# Patient Record
Sex: Female | Born: 1990
Health system: Southern US, Community
[De-identification: ages and names within clinical notes are randomized; demographics above are authoritative.]

## PROBLEM LIST (undated history)

## (undated) DIAGNOSIS — E559 Vitamin D deficiency, unspecified: Secondary | ICD-10-CM

## (undated) DIAGNOSIS — J301 Allergic rhinitis due to pollen: Secondary | ICD-10-CM

## (undated) DIAGNOSIS — J45909 Unspecified asthma, uncomplicated: Secondary | ICD-10-CM

## (undated) DIAGNOSIS — F32A Depression, unspecified: Secondary | ICD-10-CM

## (undated) DIAGNOSIS — F419 Anxiety disorder, unspecified: Secondary | ICD-10-CM

## (undated) DIAGNOSIS — F329 Major depressive disorder, single episode, unspecified: Secondary | ICD-10-CM

## (undated) DIAGNOSIS — G629 Polyneuropathy, unspecified: Secondary | ICD-10-CM

## (undated) DIAGNOSIS — M1990 Unspecified osteoarthritis, unspecified site: Secondary | ICD-10-CM

## (undated) DIAGNOSIS — D509 Iron deficiency anemia, unspecified: Secondary | ICD-10-CM

## (undated) DIAGNOSIS — G43909 Migraine, unspecified, not intractable, without status migrainosus: Secondary | ICD-10-CM

## (undated) DIAGNOSIS — K219 Gastro-esophageal reflux disease without esophagitis: Secondary | ICD-10-CM

## (undated) HISTORY — PX: WISDOM TOOTH EXTRACTION: SHX21

## (undated) HISTORY — DX: Gastro-esophageal reflux disease without esophagitis: K21.9

## (undated) HISTORY — DX: Allergic rhinitis due to pollen: J30.1

## (undated) HISTORY — PX: FOOT SURGERY: SHX648

## (undated) HISTORY — DX: Unspecified osteoarthritis, unspecified site: M19.90

---
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
## 2013-01-16 HISTORY — PX: FLAT FOOT CORRECTION: SHX6619

## 2018-01-23 ENCOUNTER — Encounter: Payer: Self-pay | Admitting: Family Medicine

## 2018-01-23 ENCOUNTER — Ambulatory Visit (INDEPENDENT_AMBULATORY_CARE_PROVIDER_SITE_OTHER): Payer: No Typology Code available for payment source | Admitting: Family Medicine

## 2018-01-23 VITALS — BP 112/86 | HR 92 | Temp 98.2°F | Ht 65.0 in | Wt 171.0 lb

## 2018-01-23 DIAGNOSIS — H6123 Impacted cerumen, bilateral: Secondary | ICD-10-CM | POA: Diagnosis not present

## 2018-01-23 DIAGNOSIS — F339 Major depressive disorder, recurrent, unspecified: Secondary | ICD-10-CM

## 2018-01-23 DIAGNOSIS — M1990 Unspecified osteoarthritis, unspecified site: Secondary | ICD-10-CM

## 2018-01-23 DIAGNOSIS — K219 Gastro-esophageal reflux disease without esophagitis: Secondary | ICD-10-CM

## 2018-01-23 DIAGNOSIS — M2141 Flat foot [pes planus] (acquired), right foot: Secondary | ICD-10-CM | POA: Diagnosis not present

## 2018-01-23 DIAGNOSIS — Z7689 Persons encountering health services in other specified circumstances: Secondary | ICD-10-CM

## 2018-01-23 DIAGNOSIS — M2142 Flat foot [pes planus] (acquired), left foot: Secondary | ICD-10-CM

## 2018-01-23 NOTE — Progress Notes (Addendum)
Patient presents to clinic today to establish care.  SUBJECTIVE: PMH: Pt is a 28 yo female with pmh sig for congenital flat feet, arthritis, seasonal allergies, h/o depression, GERD, frequent HAs.  Pt was previously seen in [place].  Pt was seen briefly at [facility].  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  GERD: -not currently taking any medicine -Notes symptoms with sugary drinks and increased alcohol intake.  Arthritis: -Mainly in the right foot -s/p surgery for congenital flat foot -May take ibuprofen 600 to 800 mg 3 times daily  H/o Depression: -notes dealing with symptoms off and on for a few yrs -tried multiple meds, notes S/Es: Celexa-constipation, Viibryd-jittery, Wellbutrin-no side effects, Zoloft 200 mg x 2 yrs- stopped working, had sexual side effects and vivid dreams. -Currently taking Effexor ER 75 mg daily.  On x 1-1.5 yrs. -Notes weight gain and vivid dreams.  Was 130lbs  -Interested in stopping if excellent. -had counseling in the past.  Does not have a provider in the area. -endorses good mood, sleep, and ok energy.  Seasonal allergies: -Endorses using azelastine and Flonase  Headaches/migraines: -symptoms with increased sinus pressure -Will take Excedrin and Allegra-D  Allergies: NKDA  Past surgical history: Left foot reconstruction 2015  Social history: Patient is single.  She is currently employed as a Marine scientist.  Patient endorses social alcohol use.  Pt denies tobacco and drug use.  Health Maintenance: Last Pap--2018,  H/o abnormal Pap 2017 LMP 01/23/2018 Immunizations --influenza vaccine 2019, TB test 2019 Colonoscopy --2018  Family medical history: Mother-alive, HTN MGM-alive, arthritis, multiple myeloma,  COPD, MI, heart disease, HLD, HTN, stroke MGF-deceased, alcohol abuse, multiple myeloma, MI, HLD  Past Medical History:  Diagnosis Date  . Arthritis   . GERD (gastroesophageal reflux disease)   . Hay fever     Past Surgical History:  Procedure Laterality Date  . FLAT FOOT CORRECTION Right 2015    Current Outpatient Medications on File Prior to Visit  Medication Sig Dispense Refill  . venlafaxine (EFFEXOR) 75 MG tablet Take 75 mg by mouth daily.     No current facility-administered medications on file prior to visit.     No Known Allergies  History reviewed. No pertinent family history.  Social History   Socioeconomic History  . Marital status: Single    Spouse name: Not on file  . Number of children: Not on file  . Years of education: Not on file  . Highest education level: Not on file  Occupational History  . Not on file  Social Needs  . Financial resource strain: Not on file  . Food insecurity:    Worry: Not on file    Inability: Not on file  . Transportation needs:    Medical: Not on file    Non-medical: Not on file  Tobacco Use  . Smoking status: Never Smoker  . Smokeless tobacco: Never Used  Substance and Sexual Activity  . Alcohol use: Yes    Comment: occassionally  . Drug use: Never  . Sexual activity: Not on file  Lifestyle  . Physical activity:    Days per week: Not on file    Minutes per session: Not on file  . Stress: Not on file  Relationships  . Social connections:  Talks on phone: Not on file    Gets together: Not on file    Attends religious service: Not on file    Active member of club or organization: Not on file    Attends meetings of clubs or organizations: Not on file    Relationship status: Not on file  . Intimate partner violence:    Fear of current or ex partner: Not on file    Emotionally abused: Not on file    Physically abused: Not on file    Forced sexual activity: Not on file  Other Topics Concern  . Not on file    Social History Narrative  . Not on file    ROS General: Denies fever, chills, night sweats, changes in weight, changes in appetite HEENT: Denies headaches, ear pain, changes in vision, rhinorrhea, sore throat CV: Denies CP, palpitations, SOB, orthopnea Pulm: Denies SOB, cough, wheezing GI: Denies abdominal pain, nausea, vomiting, diarrhea, constipation GU: Denies dysuria, hematuria, frequency, vaginal discharge Msk: Denies muscle cramps, joint pains  +foot pain R>L, flat feet Neuro: Denies weakness, numbness, tingling Skin: Denies rashes, bruising Psych: Denies depression, anxiety, hallucinations  +h/o depression  BP 112/86 (BP Location: Left Arm, Patient Position: Sitting, Cuff Size: Normal)   Pulse 92   Temp 98.2 F (36.8 C) (Oral)   Ht _0  (1.651 m)   Wt 171 lb (77.6 kg)   LMP 01/23/2018 (Exact Date)   SpO2 98%   BMI 28.46 kg/m   Physical Exam Gen. Pleasant, well developed, well-nourished, in NAD HEENT - [place]/AT, PERRL, no scleral icterus, no nasal drainage, pharynx without erythema or exudate.  B/l canals occluded with cerumen.  TMs normal after irrigation. Lungs: no use of accessory muscles, CTAB, no wheezes, rales or rhonchi Cardiovascular: RRR, No r/g/m, no peripheral edema Abdomen: BS present, soft, nontender,nondistended Neuro:  A&Ox3, CN II-XII intact, normal gait Skin:  Warm, dry, intact, no lesions  No results found for this or any previous visit (from the past 2160 hour(s)).  Assessment/Plan: Bilateral impacted cerumen -Consent obtained.  Bilateral ears irrigated.  Patient tolerated procedure well. -Given handout -Consider Debrox ear gtts.  Flat feet, bilateral  -discussed continuing orthotic use - Plan: Ambulatory referral to Podiatry  Gastroesophageal reflux disease without esophagitis -continue to avoid foods known to cause problems -given handout -if symptoms become worse discussed starting medication such as omeprazole   Arthritis  -R foot s/p  multiple foot surgeries for h/o flat feet. -continue NSAIDs prn, however advised not to take on empty stomach - Plan: Ambulatory referral to Podiatry  Depression, recurrent ([place]) -Discussed weaning off Effexor XR given weight gain, vivid dreams, and decreased effectiveness -will start Effexor XR 37.5 mg daily -obtain PHQ9 and GAD 7 -Given pt's extensive h/o s/e with various depression meds pt to find an area Teacher, music. -Given a list of area Psychiatrist, encouraged to set up appt. -pt to f/u in 6wks, sooner if needed  Encounter to establish care  -We reviewed the PMH, PSH, FH, SH, Meds and Allergies. -We provided refills for any medications we will prescribe as needed. -We addressed current concerns per orders and patient instructions. -We have asked for records for pertinent exams, studies, vaccines and notes from previous providers. -We have advised patient to follow up per instructions below.  F/u in 6 wks  Grier Mitts, MD

## 2018-01-23 NOTE — Patient Instructions (Signed)
Earwax Buildup, Adult  The ears produce a substance called earwax that helps keep bacteria out of the ear and protects the skin in the ear canal. Occasionally, earwax can build up in the ear and cause discomfort or hearing loss.  What increases the risk?  This condition is more likely to develop in people who:  · Are female.  · Are elderly.  · Naturally produce more earwax.  · Clean their ears often with cotton swabs.  · Use earplugs often.  · Use in-ear headphones often.  · Wear hearing aids.  · Have narrow ear canals.  · Have earwax that is overly thick or sticky.  · Have eczema.  · Are dehydrated.  · Have excess hair in the ear canal.  What are the signs or symptoms?  Symptoms of this condition include:  · Reduced or muffled hearing.  · A feeling of fullness in the ear or feeling that the ear is plugged.  · Fluid coming from the ear.  · Ear pain.  · Ear itch.  · Ringing in the ear.  · Coughing.  · An obvious piece of earwax that can be seen inside the ear canal.  How is this diagnosed?  This condition may be diagnosed based on:  · Your symptoms.  · Your medical history.  · An ear exam. During the exam, your health care provider will look into your ear with an instrument called an otoscope.  You may have tests, including a hearing test.  How is this treated?  This condition may be treated by:  · Using ear drops to soften the earwax.  · Having the earwax removed by a health care provider. The health care provider may:  ? Flush the ear with water.  ? Use an instrument that has a loop on the end (curette).  ? Use a suction device.  · Surgery to remove the wax buildup. This may be done in severe cases.  Follow these instructions at home:    · Take over-the-counter and prescription medicines only as told by your health care provider.  · Do not put any objects, including cotton swabs, into your ear. You can clean the opening of your ear canal with a washcloth or facial tissue.  · Follow instructions from your health care  provider about cleaning your ears. Do not over-clean your ears.  · Drink enough fluid to keep your urine clear or pale yellow. This will help to thin the earwax.  · Keep all follow-up visits as told by your health care provider. If earwax builds up in your ears often or if you use hearing aids, consider seeing your health care provider for routine, preventive ear cleanings. Ask your health care provider how often you should schedule your cleanings.  · If you have hearing aids, clean them according to instructions from the manufacturer and your health care provider.  Contact a health care provider if:  · You have ear pain.  · You develop a fever.  · You have blood, pus, or other fluid coming from your ear.  · You have hearing loss.  · You have ringing in your ears that does not go away.  · Your symptoms do not improve with treatment.  · You feel like the room is spinning (vertigo).  Summary  · Earwax can build up in the ear and cause discomfort or hearing loss.  · The most common symptoms of this condition include reduced or muffled hearing and a feeling of   fullness in the ear or feeling that the ear is plugged.  · This condition may be diagnosed based on your symptoms, your medical history, and an ear exam.  · This condition may be treated by using ear drops to soften the earwax or by having the earwax removed by a health care provider.  · Do not put any objects, including cotton swabs, into your ear. You can clean the opening of your ear canal with a washcloth or facial tissue.  This information is not intended to replace advice given to you by your health care provider. Make sure you discuss any questions you have with your health care provider.  Document Released: 02/10/2004 Document Revised: 12/14/2016 Document Reviewed: 03/15/2016  Elsevier Interactive Patient Education © 2019 [employer] Inc[employer]

## 2018-01-27 ENCOUNTER — Encounter: Payer: Self-pay | Admitting: Family Medicine

## 2018-01-27 DIAGNOSIS — M2141 Flat foot [pes planus] (acquired), right foot: Secondary | ICD-10-CM | POA: Insufficient documentation

## 2018-01-27 DIAGNOSIS — K219 Gastro-esophageal reflux disease without esophagitis: Secondary | ICD-10-CM | POA: Insufficient documentation

## 2018-01-27 DIAGNOSIS — F339 Major depressive disorder, recurrent, unspecified: Secondary | ICD-10-CM | POA: Insufficient documentation

## 2018-01-27 DIAGNOSIS — M1990 Unspecified osteoarthritis, unspecified site: Secondary | ICD-10-CM | POA: Insufficient documentation

## 2018-01-27 DIAGNOSIS — M2142 Flat foot [pes planus] (acquired), left foot: Secondary | ICD-10-CM | POA: Insufficient documentation

## 2018-02-01 ENCOUNTER — Ambulatory Visit: Payer: No Typology Code available for payment source | Admitting: Podiatry

## 2018-02-07 ENCOUNTER — Other Ambulatory Visit: Payer: Self-pay

## 2018-02-08 ENCOUNTER — Encounter: Payer: Self-pay | Admitting: Podiatry

## 2018-02-08 ENCOUNTER — Ambulatory Visit: Payer: No Typology Code available for payment source | Admitting: Podiatry

## 2018-02-08 ENCOUNTER — Ambulatory Visit (INDEPENDENT_AMBULATORY_CARE_PROVIDER_SITE_OTHER): Payer: No Typology Code available for payment source

## 2018-02-08 VITALS — BP 119/87 | HR 78

## 2018-02-08 DIAGNOSIS — M2142 Flat foot [pes planus] (acquired), left foot: Secondary | ICD-10-CM

## 2018-02-08 DIAGNOSIS — M2141 Flat foot [pes planus] (acquired), right foot: Secondary | ICD-10-CM

## 2018-02-08 DIAGNOSIS — M25571 Pain in right ankle and joints of right foot: Secondary | ICD-10-CM

## 2018-02-10 NOTE — Progress Notes (Addendum)
  Subjective:  Patient ID: Jacqueline Sims, female    DOB: Oct 31, 1990,  MRN: [id number]  Chief Complaint  Patient presents with  . Flat Foot    bilateral - she has had multiple surgeries on her right foot - interested in new orthotics especially for the right foot. She got her first orthotics at age 28, but did not wear them because they hurt her feet   28 y.o. female presents with the above complaint.  Had history of flatfoot correction on the right foot having a lot of pain interested in possible injection therapy as well as pursuing orthotics.  Has seen Dr. Loletta Specter for this who told her she may need a subtalar joint fusion in the future.  Review of Systems: Negative except as noted in the HPI. Denies N/V/F/Ch.  Past Medical History:  Diagnosis Date  . Arthritis   . GERD (gastroesophageal reflux disease)   . Hay fever     Current Outpatient Medications:  .  azelastine (ASTELIN) 0.1 % nasal spray, Place into the nose., Disp: , Rfl:  .  celecoxib (CELEBREX) 100 MG capsule, , Disp: , Rfl:  .  fluticasone (FLONASE) 50 MCG/ACT nasal spray, Place into the nose., Disp: , Rfl:  .  loratadine-pseudoephedrine (CLARITIN-D 12-HOUR) 5-120 MG tablet, Take by mouth., Disp: , Rfl:  .  norgestimate-ethinyl estradiol (ORTHO-CYCLEN,SPRINTEC,PREVIFEM) 0.25-35 MG-MCG tablet, Take by mouth., Disp: , Rfl:  .  venlafaxine (EFFEXOR) 75 MG tablet, Take 75 mg by mouth daily., Disp: , Rfl:   Social History   Tobacco Use  Smoking Status Never Smoker  Smokeless Tobacco Never Used    Allergies  Allergen Reactions  . Lactose Nausea And Vomiting   Objective:   Vitals:   02/08/18 0752  BP: 119/87  Pulse: 78   There is no height or weight on file to calculate BMI. Constitutional Well developed. Well nourished.  Vascular Dorsalis pedis pulses palpable bilaterally. Posterior tibial pulses palpable bilaterally. Capillary refill normal to all digits.  No cyanosis or clubbing noted. Pedal hair  growth normal.  Neurologic Normal speech. Oriented to person, place, and time. Epicritic sensation to light touch grossly present bilaterally.  Dermatologic Nails well groomed and normal in appearance. No open wounds. No skin lesions. Well-healed incisions right foot  Orthopedic: Normal joint ROM without pain or crepitus bilaterally. No visible deformities. Pain palpation about the right sinus tarsi   Radiographs: Taken and reviewed evidence of prior calcaneal osteotomies with intact implants.  Posterior facet arthritic changes noted. Assessment:   1. Flat feet, bilateral   2. Sinus tarsi syndrome of right ankle   3. Arthralgia of hindfoot, right   4. Pain in joint of right foot    Plan:  Patient was evaluated and treated and all questions answered.  Sinus tarsitis right, concomitant flatfoot s/p correction -XR reviewed as above. -Injection delivered to the right sinus tarsi as below -Would benefit from custom molded orthotics.  Casted today met with orthotist Velora Heckler for fabrication -Would likely benefit from subtalar joint arthrodesis in the future.  Procedure: Injection Subtalar Joint Consent: Verbal consent obtained. Location: Right subtalar joint Skin prep: Betadine. Injectate: 1 cc 0.5% marcaine plain, 1 cc dexamethasone phosphate, 0.5 cc kenalog 10. Disposition: Patient tolerated procedure well. Injection site dressed with a band-aid.     Return in about 3 weeks (around 03/01/2018) for Sinus tarsitis Right.

## 2018-02-15 ENCOUNTER — Ambulatory Visit (INDEPENDENT_AMBULATORY_CARE_PROVIDER_SITE_OTHER): Payer: No Typology Code available for payment source | Admitting: Psychiatry

## 2018-02-15 DIAGNOSIS — F3111 Bipolar disorder, current episode manic without psychotic features, mild: Secondary | ICD-10-CM | POA: Diagnosis not present

## 2018-02-15 DIAGNOSIS — F411 Generalized anxiety disorder: Secondary | ICD-10-CM | POA: Diagnosis not present

## 2018-02-15 MED ORDER — VENLAFAXINE HCL 37.5 MG PO TABS: 37.5000 mg | ORAL_TABLET | Freq: Every day | ORAL | 0 refills | Status: DC

## 2018-02-15 MED ORDER — DIVALPROEX SODIUM ER 500 MG PO TB24: ORAL_TABLET | ORAL | 0 refills | Status: DC

## 2018-02-15 MED FILL — DIVALPROEX SOD ER 500 MG TA: 500 | 30 days supply | Qty: 60 | Fill #0

## 2018-02-15 MED FILL — VENLAFAXINE HCL 37.5 MG TAB: 37.5 | 14 days supply | Qty: 14 | Fill #0

## 2018-02-15 NOTE — Progress Notes (Signed)
[facility]  Patient ID: Jacqueline Sims,  MRN: [id number]  PCP: Billie Ruddy, MD  Date of Evaluation: 02/15/2018  Comer Locket, PA-C [facility] MD/PA/NP Initial Note  02/15/2018 9:29 AM Jacqueline Sims  MRN:  [id number]  Chief Complaint: Feeling hyper  HPI: Patient feels hyper.  Is been several months, increased talking, occasionally decreased sleep, impulsive spending, goal oriented.  Denies grandiosity.  Had prior episodes of shorter duration occurring once or twice a month; 2-3 times a year.  Her MDQ was positive with moderate problems.  He does notice being able to go from a good mood quickly to a depressive mood.  Also has irritable moods. She has had depression off and on since she is 28 years old.  At 28 years old to 62 she took medicines.  When she is depressed she works less and lays in bed.  Episodes last 2 to 3 months most recent was the spring of last year.  Episodes can also last just 1 or 2 days and he has she has 1 at least 1 year.  During her depression she did not cry, I does isolate, decreased energy, decreased motivation, increased sleep, decreased ADLs including decreased bathing.  Has anhedonia.  Said suicidal thoughts the first time was at 28 years old it lasted for 5 months had a bad depression and suicidal thoughts again at 28 years old this suicidality occur when she is really depressed.  suicidal plans include pills, hanging, motor vehicle accident, no gun.  She says suicidal plan twice in the past 6 years.  Her only suicide gesture was cutting.  Most recent suicidal thoughts March 2019.  She is not depressed now. Anxiety is social anxiety is no worse panic attacks 2 years ago.  OCD has obsessive thoughts No psychosis Patient wonders if she is ADD.  Visit Diagnosis: Borderline personality disorder. Bipolar disorder.  Past Psychiatric History: Her depression started at 28 years old.  She is hyper at least 6 years.  She has seen a psychiatrist  in the past in [place].  She has never been hospitalized.  She did take Zoloft at 28 years old. Past medicine Strattera Zoloft Effexor Celexa and Viibryd important to note that Viibryd caused jitteriness energy denies decreased sleep not impulsive possible grandiosity talks more goal oriented this last several weeks it stopped and resolved this is similar to her hyper moods.  Past Medical History: Past medical history is negative except for foot surgery. Past Medical History:  Diagnosis Date  . Arthritis   . GERD (gastroesophageal reflux disease)   . Hay fever     Past Surgical History:  Procedure Laterality Date  . FLAT FOOT CORRECTION Right 2015    Family Psychiatric History: Mother and cousins are have anxiety.  Depression in her mom.  Eating disorder as a cousin.  Suicide attempt with her cousin. Grandfather and uncle and mom with problems with alcohol.  One uncle has problems with drugs.  Past abuse mom was abused.  Family History: MI in her grandmother and grandfather, cardiac dysrhythmias in her grandmother, stroke in a grandmother.  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  She is single with no children.  Religious or spiritual things are not of interest to her. Caffeine 2 to 2 cups a day.  No tobacco.  Rare alcohol 2-3 times a year.  Not using any drugs.  In the past she has had increased alcohol she not had blackouts  or seizure or withdrawals.  Past drugs tried were pot, LSD x1, mushrooms x1, cocaine x1, Vyvanse x1. Current medications include Effexor XR 75 mg a day. She is not allergic to any medicines.  Sleep she has problems with sleep onset and sleep maintenance and she does seem to have a night eating syndrome 4 to 5 days a week Social History   Socioeconomic History  . Marital status: Single    Spouse name: Not on file  . Number of children: Not on file  . Years of education: Not on file  . Highest education level: Not on  file  Occupational History  . Not on file  Social Needs  . Financial resource strain: Not on file  . Food insecurity:    Worry: Not on file    Inability: Not on file  . Transportation needs:    Medical: Not on file    Non-medical: Not on file  Tobacco Use  . Smoking status: Never Smoker  . Smokeless tobacco: Never Used  Substance and Sexual Activity  . Alcohol use: Yes    Comment: occassionally  . Drug use: Never  . Sexual activity: Not on file  Lifestyle  . Physical activity:    Days per week: Not on file    Minutes per session: Not on file  . Stress: Not on file  Relationships  . Social connections:    Talks on phone: Not on file    Gets together: Not on file    Attends religious service: Not on file    Active member of club or organization: Not on file    Attends meetings of clubs or organizations: Not on file    Relationship status: Not on file  Other Topics Concern  . Not on file  Social History Narrative  . Not on file    Allergies:  Allergies  Allergen Reactions  . Lactose Nausea And Vomiting    Metabolic Disorder Labs: No results found for: HGBA1C, MPG No results found for: PROLACTIN No results found for: CHOL, TRIG, HDL, CHOLHDL, VLDL, LDLCALC No results found for: TSH  Therapeutic Level Labs: No results found for: LITHIUM No results found for: VALPROATE No components found for:  CBMZ  Current Medications: Current Outpatient Medications  Medication Sig Dispense Refill  . azelastine (ASTELIN) 0.1 % nasal spray Place into the nose.    . celecoxib (CELEBREX) 100 MG capsule     . fluticasone (FLONASE) 50 MCG/ACT nasal spray Place into the nose.    . loratadine-pseudoephedrine (CLARITIN-D 12-HOUR) 5-120 MG tablet Take by mouth.    . norgestimate-ethinyl estradiol (ORTHO-CYCLEN,SPRINTEC,PREVIFEM) 0.25-35 MG-MCG tablet Take by mouth.    . venlafaxine (EFFEXOR) 75 MG tablet Take 75 mg by mouth daily.     No current facility-administered medications  for this visit.     Medication Side Effects: none  Orders placed this visit: She is to start Depakote ER 500 mg a day for a week and then move to thousand milligrams a day til seen again.  Effexor she is to stay at 75 mg for 2 weeks and then she should go to 37-1/2 mg a day for 2 weeks and then stop  Psychiatric Specialty Exam:  ROS negative  Last menstrual period 01/23/2018.  General Appearance: Casual  Eye Contact:  Good  Speech:  Clear and Coherent  Volume:  Normal  Mood:  Euthymic  Affect:  Appropriate  Thought Process:  Linear  Orientation:  Full (Time, Place, and Person)  Thought  Content: Logical   Suicidal Thoughts:  No  Homicidal Thoughts:  No  Memory:  WNL  Judgement:  Good  Insight:  Good  Psychomotor Activity:  Normal  Concentration:  Concentration: Good  Recall:  Good  Fund of Knowledge: Good  Language: Good  Assets:  Desire for Improvement  ADL's:  Intact  Cognition: WNL  Prognosis:  Good   Screenings: MDQ positive with moderate problems  Receiving Psychotherapy: Yes   Treatment Plan/Recommendations: She will start Depakote ER 500 mg a day for a week and then moved to thousand milligrams a day until seen again.  She is to continue Effexor XR 75 mg a day for 2 weeks then go to 37-1/2 mg a day for 2 weeks and then stop.  She is to decrease caffeine.  She uses Benadryl to help with her sleep.  She does [facility]   She agrees to no suicide pledge.  Agrees to do mood diary.  Return in 1 month.  Will work on her night eating syndrome which she does 4 to 5 days a week.  She will be encouraged to bring this up with her counselor. Patient does wonder if she has ADD ADHD we will follow that in the future.    Comer Locket, PA-C

## 2018-02-22 MED FILL — FEMYNOR 0.25-35 MG-MCG TABS: 0.25-35 | 84 days supply | Qty: 84 | Fill #0

## 2018-03-01 ENCOUNTER — Ambulatory Visit: Payer: No Typology Code available for payment source | Admitting: Podiatry

## 2018-03-01 ENCOUNTER — Encounter: Payer: Self-pay | Admitting: Podiatry

## 2018-03-01 DIAGNOSIS — M25571 Pain in right ankle and joints of right foot: Secondary | ICD-10-CM

## 2018-03-05 ENCOUNTER — Encounter: Payer: Self-pay | Admitting: Family Medicine

## 2018-03-05 ENCOUNTER — Ambulatory Visit: Payer: No Typology Code available for payment source | Admitting: Gynecology

## 2018-03-05 DIAGNOSIS — Z0289 Encounter for other administrative examinations: Secondary | ICD-10-CM

## 2018-03-08 ENCOUNTER — Encounter: Payer: Self-pay | Admitting: Family Medicine

## 2018-03-08 ENCOUNTER — Ambulatory Visit (INDEPENDENT_AMBULATORY_CARE_PROVIDER_SITE_OTHER): Payer: No Typology Code available for payment source | Admitting: Family Medicine

## 2018-03-08 ENCOUNTER — Encounter: Payer: No Typology Code available for payment source | Admitting: Orthotics

## 2018-03-08 VITALS — BP 118/82 | HR 95 | Temp 98.3°F | Wt 177.0 lb

## 2018-03-08 DIAGNOSIS — L0231 Cutaneous abscess of buttock: Secondary | ICD-10-CM | POA: Diagnosis not present

## 2018-03-08 MED ORDER — SULFAMETHOXAZOLE-TRIMETHOPRIM 800-160 MG PO TABS: 1.0000 | ORAL_TABLET | Freq: Two times a day (BID) | ORAL | 0 refills | Status: AC

## 2018-03-08 MED ORDER — AZELASTINE HCL 0.1 % NA SOLN: 1.0000 | Freq: Every day | NASAL | 4 refills | Status: DC

## 2018-03-08 MED FILL — AZELASTINE HCL 137 MCG SPRY: 0.1 | 90 days supply | Qty: 30 | Fill #0

## 2018-03-08 MED FILL — SULFAMETHOXAZOLE-TMP DS TAB: 800-160 | 7 days supply | Qty: 14 | Fill #0

## 2018-03-08 NOTE — Progress Notes (Signed)
Acute Office Visit  Subjective:    Patient ID: Jacqueline Sims, female    DOB: 09-27-1990, 28 y.o.   MRN: [id number]  No chief complaint on file.   HPI Patient is in today for acute concern.  Pt endorses recurring perianal abscess.  Pt states in the past she has had to get the area I&D'd and packed.  Area reoccurred several months ago but pt did not seek treatment.  Pt notes the area began draining yesterday.  Pt denies fever, chills, nausea, vomiting.  Pt has not taken anything for this.  Pt states she gets abscess frequently in her groin and axilla.  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  Past Medical History:  Diagnosis Date  . Arthritis   . GERD (gastroesophageal reflux disease)   . Hay fever     Past Surgical History:  Procedure Laterality Date  . FLAT FOOT CORRECTION Right 2015    History reviewed. No pertinent family history.  Social History   Socioeconomic History  . Marital status: Single    Spouse name: Not on file  . Number of children: Not on file  . Years of education: Not on file  . Highest education level: Not on file  Occupational History  . Not on file  Social Needs  . Financial resource strain: Not on file  . Food insecurity:    Worry: Not on file    Inability: Not on file  . Transportation needs:    Medical: Not on file    Non-medical: Not on file  Tobacco Use  . Smoking status: Never Smoker  . Smokeless tobacco: Never Used  Substance and Sexual Activity  . Alcohol use: Yes    Comment: occassionally  . Drug use: Never  . Sexual activity: Not on file  Lifestyle  . Physical activity:    Days per week: Not on file    Minutes per session: Not on file  . Stress: Not on file  Relationships  . Social connections:    Talks on phone: Not on file    Gets together: Not on file    Attends religious service: Not on file    Active member of club or organization: Not on file    Attends meetings of clubs or  organizations: Not on file    Relationship status: Not on file  . Intimate partner violence:    Fear of current or ex partner: Not on file    Emotionally abused: Not on file    Physically abused: Not on file    Forced sexual activity: Not on file  Other Topics Concern  . Not on file  Social History Narrative  . Not on file    Outpatient Medications Prior to Visit  Medication Sig Dispense Refill  . azelastine (ASTELIN) 0.1 % nasal spray Place into the nose.    . fluticasone (FLONASE) 50 MCG/ACT nasal spray Place into the nose.    . loratadine (CLARITIN) 10 MG tablet Take 10 mg by mouth daily.    . norgestimate-ethinyl estradiol (ORTHO-CYCLEN,SPRINTEC,PREVIFEM) 0.25-35 MG-MCG tablet Take by mouth.    . venlafaxine (EFFEXOR) 37.5 MG tablet Take 1 tablet (37.5 mg total) by mouth daily. 14 tablet 0  . celecoxib (CELEBREX) 100 MG capsule     . divalproex (DEPAKOTE ER) 500 MG 24 hr tablet 1 hs for a week then 2 hs 60 tablet 0  . loratadine-pseudoephedrine (CLARITIN-D 12-HOUR) 5-120 MG tablet Take by mouth.  No facility-administered medications prior to visit.     Allergies  Allergen Reactions  . Lactose Nausea And Vomiting    ROS General: Denies fever, chills, night sweats, changes in weight, changes in appetite HEENT: Denies headaches, ear pain, changes in vision, rhinorrhea, sore throat CV: Denies CP, palpitations, SOB, orthopnea Pulm: Denies SOB, cough, wheezing GI: Denies abdominal pain, nausea, vomiting, diarrhea, constipation GU: Denies dysuria, hematuria, frequency, vaginal discharge Msk: Denies muscle cramps, joint pains Neuro: Denies weakness, numbness, tingling Skin: Denies rashes, bruising  +perianal abscess Psych: Denies depression, anxiety, hallucinations     Objective:    Physical Exam  Constitutional: She is oriented to person, place, and time. She appears well-developed and well-nourished.  HENT:  Head: Normocephalic and atraumatic.  Eyes: Pupils are  equal, round, and reactive to light. Conjunctivae are normal.  Cardiovascular: Normal rate.  Pulmonary/Chest: Effort normal and breath sounds normal.  Genitourinary:       Genitourinary Comments: Area best appreciated with pt laying on her L side.  L medial gluteal sulcus with abscess.  No induration, minimal erythema, no increased warmth.  Center of lesion open.  Mild TTP.  No drainage expressed.   Musculoskeletal: Normal range of motion.  Neurological: She is alert and oriented to person, place, and time.  Skin: Skin is warm and dry.  Vitals reviewed.  BP 118/82   Pulse 95   Temp 98.3 F (36.8 C) (Oral)   Wt 177 lb (80.3 kg)   SpO2 98%   BMI 29.45 kg/m  Wt Readings from Last 3 Encounters:  03/08/18 177 lb (80.3 kg)  01/23/18 171 lb (77.6 kg)    Health Maintenance Due  Topic Date Due  . HIV Screening  06/12/2006  . TETANUS/TDAP  06/12/2010  . PAP-Cervical Cytology Screening  06/11/2012  . PAP SMEAR-Modifier  06/11/2012    There are no preventive care reminders to display for this patient.   No results found for: TSH No results found for: WBC, HGB, HCT, MCV, PLT No results found for: NA, K, CHLORIDE, CO2, GLUCOSE, BUN, CREATININE, BILITOT, ALKPHOS, AST, ALT, PROT, ALBUMIN, CALCIUM, ANIONGAP, EGFR, GFR No results found for: CHOL No results found for: HDL No results found for: LDLCALC No results found for: TRIG No results found for: CHOLHDL No results found for: HGBA1C     Assessment & Plan:   Problem List Items Addressed This Visit    None    Visit Diagnoses    Abscess of buttock, left    -  Primary   Relevant Medications   sulfamethoxazole-trimethoprim (BACTRIM DS,SEPTRA DS) 800-160 MG tablet       Meds ordered this encounter  Medications  . sulfamethoxazole-trimethoprim (BACTRIM DS,SEPTRA DS) 800-160 MG tablet    Sig: Take 1 tablet by mouth 2 (two) times daily for 7 days.    Dispense:  14 tablet    Refill:  0   Abscess -Patient encouraged to use  warm compresses to promote continued drainage of the area -We will start Bactrim twice daily -Given handout -Given RTC or ED precautions for fever, chills, nausea, vomiting, worsening of symptoms, etc.  Follow-up PRN  Billie Ruddy, MD

## 2018-03-08 NOTE — Patient Instructions (Signed)
Skin Abscess  A skin abscess is an infected area on or under your skin that contains a collection of pus and other material. An abscess may also be called a furuncle, carbuncle, or boil. An abscess can occur in or on almost any part of your body. Some abscesses break open (rupture) on their own. Most continue to get worse unless they are treated. The infection can spread deeper into the body and eventually into your blood, which can make you feel ill. Treatment usually involves draining the abscess. What are the causes? An abscess occurs when germs, like bacteria, pass through your skin and cause an infection. This may be caused by:  A scrape or cut on your skin.  A puncture wound through your skin, including a needle injection or insect bite.  Blocked oil or sweat glands.  Blocked and infected hair follicles.  A cyst that forms beneath your skin (sebaceous cyst) and becomes infected. What increases the risk? This condition is more likely to develop in people who:  Have a weak body defense system (immune system).  Have diabetes.  Have dry and irritated skin.  Get frequent injections or use illegal IV drugs.  Have a foreign body in a wound, such as a splinter.  Have problems with their lymph system or veins. What are the signs or symptoms? Symptoms of this condition include:  A painful, firm bump under the skin.  A bump with pus at the top. This may break through the skin and drain. Other symptoms include:  Redness surrounding the abscess site.  Warmth.  Swelling of the lymph nodes (glands) near the abscess.  Tenderness.  A sore on the skin. How is this diagnosed? This condition may be diagnosed based on:  A physical exam.  Your medical history.  A sample of pus. This may be used to find out what is causing the infection.  Blood tests.  Imaging tests, such as an ultrasound, CT scan, or MRI. How is this treated? A small abscess that drains on its own may not  need treatment. Treatment for larger abscesses may include:  Moist heat or heat pack applied to the area several times a day.  A procedure to drain the abscess (incision and drainage).  Antibiotic medicines. For a severe abscess, you may first get antibiotics through an IV and then change to antibiotics by mouth. Follow these instructions at home: Medicines   Take over-the-counter and prescription medicines only as told by your health care provider.  If you were prescribed an antibiotic medicine, take it as told by your health care provider. Do not stop taking the antibiotic even if you start to feel better. Abscess care   If you have an abscess that has not drained, apply heat to the affected area. Use the heat source that your health care provider recommends, such as a moist heat pack or a heating pad. ? Place a towel between your skin and the heat source. ? Leave the heat on for 20-30 minutes. ? Remove the heat if your skin turns bright red. This is especially important if you are unable to feel pain, heat, or cold. You may have a greater risk of getting burned.  Follow instructions from your health care provider about how to take care of your abscess. Make sure you: ? Cover the abscess with a bandage (dressing). ? Change your dressing or gauze as told by your health care provider. ? Wash your hands with soap and water before you change the   dressing or gauze. If soap and water are not available, use hand sanitizer.  Check your abscess every day for signs of a worsening infection. Check for: ? More redness, swelling, or pain. ? More fluid or blood. ? Warmth. ? More pus or a bad smell. General instructions  To avoid spreading the infection: ? Do not share personal care items, towels, or hot tubs with others. ? Avoid making skin contact with other people.  Keep all follow-up visits as told by your health care provider. This is important. Contact a health care provider if you  have:  More redness, swelling, or pain around your abscess.  More fluid or blood coming from your abscess.  Warm skin around your abscess.  More pus or a bad smell coming from your abscess.  A fever.  Muscle aches.  Chills or a general ill feeling. Get help right away if you:  Have severe pain.  See red streaks on your skin spreading away from the abscess. Summary  A skin abscess is an infected area on or under your skin that contains a collection of pus and other material.  A small abscess that drains on its own may not need treatment.  Treatment for larger abscesses may include having a procedure to drain the abscess and taking an antibiotic. This information is not intended to replace advice given to you by your health care provider. Make sure you discuss any questions you have with your health care provider. Document Released: 10/12/2004 Document Revised: 02/15/2017 Document Reviewed: 02/15/2017 Elsevier Interactive Patient Education  2019 [employer]  

## 2018-03-11 ENCOUNTER — Ambulatory Visit: Payer: No Typology Code available for payment source | Admitting: Family Medicine

## 2018-03-11 ENCOUNTER — Telehealth: Payer: Self-pay | Admitting: Psychiatry

## 2018-03-11 ENCOUNTER — Other Ambulatory Visit: Payer: Self-pay

## 2018-03-11 MED ORDER — VENLAFAXINE HCL 37.5 MG PO TABS: 37.5000 mg | ORAL_TABLET | Freq: Every day | ORAL | 0 refills | Status: DC

## 2018-03-11 MED FILL — VENLAFAXINE HCL 37.5 MG TAB: 37.5 | 7 days supply | Qty: 7 | Fill #0

## 2018-03-11 NOTE — Telephone Encounter (Signed)
Patient called and said that she needs 7 morepills of effexor 37.5 mg to help her continue weaning off. Please escribe to [facility] out patient pharmacy.

## 2018-03-11 NOTE — Telephone Encounter (Signed)
rx submitted to [facility] as requested.

## 2018-03-12 ENCOUNTER — Ambulatory Visit: Payer: No Typology Code available for payment source

## 2018-03-12 DIAGNOSIS — M2142 Flat foot [pes planus] (acquired), left foot: Secondary | ICD-10-CM

## 2018-03-12 DIAGNOSIS — M25571 Pain in right ankle and joints of right foot: Secondary | ICD-10-CM

## 2018-03-12 DIAGNOSIS — M2141 Flat foot [pes planus] (acquired), right foot: Secondary | ICD-10-CM

## 2018-03-12 NOTE — Patient Instructions (Signed)

## 2018-03-12 NOTE — Progress Notes (Signed)
Patient presents for orthotic pick up.  Verbal and written break in and wear instructions given.  Patient will follow up in 4 weeks with Dr if symptoms worsen or fail to improve. 

## 2018-03-16 NOTE — Progress Notes (Signed)
  Subjective:  Patient ID: Jacqueline Sims, female    DOB: December 02, 1990,  MRN: [id number]  Chief Complaint  Patient presents with  . Foot Pain    Follow up sinus tarsitis right   "Its feeling much better, some pain"  Patient states she is picking up orthotics with [name] too   28 y.o. female presents with the above complaint.  States the area is feeling much better having some pain but thinks the injection very much helped Review of Systems: Negative except as noted in the HPI. Denies N/V/F/Ch.  Past Medical History:  Diagnosis Date  . Arthritis   . GERD (gastroesophageal reflux disease)   . Hay fever     Current Outpatient Medications:  .  azelastine (ASTELIN) 0.1 % nasal spray, Place 1 spray into both nostrils daily., Disp: 30 mL, Rfl: 4 .  fluticasone (FLONASE) 50 MCG/ACT nasal spray, Place into the nose., Disp: , Rfl:  .  loratadine (CLARITIN) 10 MG tablet, Take 10 mg by mouth daily., Disp: , Rfl:  .  norgestimate-ethinyl estradiol (ORTHO-CYCLEN,SPRINTEC,PREVIFEM) 0.25-35 MG-MCG tablet, Take by mouth., Disp: , Rfl:  .  venlafaxine (EFFEXOR) 37.5 MG tablet, Take 1 tablet (37.5 mg total) by mouth daily., Disp: 7 tablet, Rfl: 0  Social History   Tobacco Use  Smoking Status Never Smoker  Smokeless Tobacco Never Used    Allergies  Allergen Reactions  . Lactose Nausea And Vomiting   Objective:   There were no vitals filed for this visit. There is no height or weight on file to calculate BMI. Constitutional Well developed. Well nourished.  Vascular Dorsalis pedis pulses palpable bilaterally. Posterior tibial pulses palpable bilaterally. Capillary refill normal to all digits.  No cyanosis or clubbing noted. Pedal hair growth normal.  Neurologic Normal speech. Oriented to person, place, and time. Epicritic sensation to light touch grossly present bilaterally.  Dermatologic Nails well groomed and normal in appearance. No open wounds. No skin lesions. Well-healed  incisions right foot  Orthopedic: Normal joint ROM without pain or crepitus bilaterally. No visible deformities. Pain palpation about the right sinus tarsi   Radiographs: None Assessment:   1. Sinus tarsi syndrome of right ankle   2. Arthralgia of hindfoot, right   3. Pain in joint of right foot    Plan:  Patient was evaluated and treated and all questions answered.  Sinus tarsitis right, concomitant flatfoot s/p correction -Improving. -Trial orthotic therapy -Should pain persist would consider surgical dimension however she would ideally benefit and hold off as long as possible as she likely needs a joint fusion   Return in about 6 weeks (around 04/11/2018).

## 2018-03-18 ENCOUNTER — Ambulatory Visit: Payer: No Typology Code available for payment source | Admitting: Psychiatry

## 2018-03-28 ENCOUNTER — Ambulatory Visit (INDEPENDENT_AMBULATORY_CARE_PROVIDER_SITE_OTHER): Payer: No Typology Code available for payment source | Admitting: Family Medicine

## 2018-03-28 ENCOUNTER — Other Ambulatory Visit: Payer: Self-pay

## 2018-03-28 ENCOUNTER — Encounter: Payer: Self-pay | Admitting: Family Medicine

## 2018-03-28 VITALS — BP 108/80 | HR 88 | Temp 98.0°F | Wt 173.0 lb

## 2018-03-28 DIAGNOSIS — J0100 Acute maxillary sinusitis, unspecified: Secondary | ICD-10-CM | POA: Diagnosis not present

## 2018-03-28 MED ORDER — AMOXICILLIN 500 MG PO CAPS: 500.0000 mg | ORAL_CAPSULE | Freq: Two times a day (BID) | ORAL | 0 refills | Status: AC

## 2018-03-28 MED FILL — AMOXICILLIN 500 MG CAPSULE: 500 | 7 days supply | Qty: 14 | Fill #0

## 2018-03-28 NOTE — Patient Instructions (Signed)
Sinusitis, Adult  Sinusitis is inflammation of your sinuses. Sinuses are hollow spaces in the bones around your face. Your sinuses are located:   Around your eyes.   In the middle of your forehead.   Behind your nose.   In your cheekbones.  Mucus normally drains out of your sinuses. When your nasal tissues become inflamed or swollen, mucus can become trapped or blocked. This allows bacteria, viruses, and fungi to grow, which leads to infection. Most infections of the sinuses are caused by a virus.  Sinusitis can develop quickly. It can last for up to 4 weeks (acute) or for more than 12 weeks (chronic). Sinusitis often develops after a cold.  What are the causes?  This condition is caused by anything that creates swelling in the sinuses or stops mucus from draining. This includes:   Allergies.   Asthma.   Infection from bacteria or viruses.   Deformities or blockages in your nose or sinuses.   Abnormal growths in the nose (nasal polyps).   Pollutants, such as chemicals or irritants in the air.   Infection from fungi (rare).  What increases the risk?  You are more likely to develop this condition if you:   Have a weak body defense system (immune system).   Do a lot of swimming or diving.   Overuse nasal sprays.   Smoke.  What are the signs or symptoms?  The main symptoms of this condition are pain and a feeling of pressure around the affected sinuses. Other symptoms include:   Stuffy nose or congestion.   Thick drainage from your nose.   Swelling and warmth over the affected sinuses.   Headache.   Upper toothache.   A cough that may get worse at night.   Extra mucus that collects in the throat or the back of the nose (postnasal drip).   Decreased sense of smell and taste.   Fatigue.   A fever.   Sore throat.   Bad breath.  How is this diagnosed?  This condition is diagnosed based on:   Your symptoms.   Your medical history.   A physical exam.   Tests to find out if your condition is  acute or chronic. This may include:  ? Checking your nose for nasal polyps.  ? Viewing your sinuses using a device that has a light (endoscope).  ? Testing for allergies or bacteria.  ? Imaging tests, such as an MRI or CT scan.  In rare cases, a bone biopsy may be done to rule out more serious types of fungal sinus disease.  How is this treated?  Treatment for sinusitis depends on the cause and whether your condition is chronic or acute.   If caused by a virus, your symptoms should go away on their own within 10 days. You may be given medicines to relieve symptoms. They include:  ? Medicines that shrink swollen nasal passages (topical intranasal decongestants).  ? Medicines that treat allergies (antihistamines).  ? A spray that eases inflammation of the nostrils (topical intranasal corticosteroids).  ? Rinses that help get rid of thick mucus in your nose (nasal saline washes).   If caused by bacteria, your health care provider may recommend waiting to see if your symptoms improve. Most bacterial infections will get better without antibiotic medicine. You may be given antibiotics if you have:  ? A severe infection.  ? A weak immune system.   If caused by narrow nasal passages or nasal polyps, you may need   to have surgery.  Follow these instructions at home:  Medicines   Take, use, or apply over-the-counter and prescription medicines only as told by your health care provider. These may include nasal sprays.   If you were prescribed an antibiotic medicine, take it as told by your health care provider. Do not stop taking the antibiotic even if you start to feel better.  Hydrate and humidify     Drink enough fluid to keep your urine pale yellow. Staying hydrated will help to thin your mucus.   Use a cool mist humidifier to keep the humidity level in your home above 50%.   Inhale steam for 10-15 minutes, 3-4 times a day, or as told by your health care provider. You can do this in the bathroom while a hot shower is  running.   Limit your exposure to cool or dry air.  Rest   Rest as much as possible.   Sleep with your head raised (elevated).   Make sure you get enough sleep each night.  General instructions     Apply a warm, moist washcloth to your face 3-4 times a day or as told by your health care provider. This will help with discomfort.   Wash your hands often with soap and water to reduce your exposure to germs. If soap and water are not available, use hand sanitizer.   Do not smoke. Avoid being around people who are smoking (secondhand smoke).   Keep all follow-up visits as told by your health care provider. This is important.  Contact a health care provider if:   You have a fever.   Your symptoms get worse.   Your symptoms do not improve within 10 days.  Get help right away if:   You have a severe headache.   You have persistent vomiting.   You have severe pain or swelling around your face or eyes.   You have vision problems.   You develop confusion.   Your neck is stiff.   You have trouble breathing.  Summary   Sinusitis is soreness and inflammation of your sinuses. Sinuses are hollow spaces in the bones around your face.   This condition is caused by nasal tissues that become inflamed or swollen. The swelling traps or blocks the flow of mucus. This allows bacteria, viruses, and fungi to grow, which leads to infection.   If you were prescribed an antibiotic medicine, take it as told by your health care provider. Do not stop taking the antibiotic even if you start to feel better.   Keep all follow-up visits as told by your health care provider. This is important.  This information is not intended to replace advice given to you by your health care provider. Make sure you discuss any questions you have with your health care provider.  Document Released: 01/02/2005 Document Revised: 06/04/2017 Document Reviewed: 06/04/2017  Elsevier Interactive Patient Education  2019 [employer]vier Inc[employer]

## 2018-03-28 NOTE — Progress Notes (Signed)
Subjective:    Patient ID: Jacqueline Sims, female    DOB: Oct 15, 1990, 28 y.o.   MRN: [id number]  No chief complaint on file.   HPI Patient was seen today for acute concern.  Pt with HAs, facial pain/pressure x 1 wk.  Pt unsure if symptoms 2/2 allergies, sinus issues, or weaning off effexor.  Pt taking flonase, claritin, and tylenol for symptoms.  Pt has been off Effexor x a few days, followed by psychiatry.  Pt also notes tooth pain, but attributes it to several cavities.  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  Pt denies fever, chills, N/V, cough.  Past Medical History:  Diagnosis Date  . Arthritis   . GERD (gastroesophageal reflux disease)   . Hay fever     Allergies  Allergen Reactions  . Lactose Nausea And Vomiting    ROS General: Denies fever, chills, night sweats, changes in weight, changes in appetite HEENT: Denies ear pain, changes in vision, rhinorrhea, sore throat  +facial pain/pressure, HA, tooth pain CV: Denies CP, palpitations, SOB, orthopnea Pulm: Denies SOB, cough, wheezing GI: Denies abdominal pain, nausea, vomiting, diarrhea, constipation GU: Denies dysuria, hematuria, frequency, vaginal discharge Msk: Denies muscle cramps, joint pains Neuro: Denies weakness, numbness, tingling Skin: Denies rashes, bruising Psych: Denies depression, anxiety, hallucinations    Objective:    Blood pressure 108/80, pulse 88, temperature 98 F (36.7 C), temperature source Oral, weight 173 lb (78.5 kg), SpO2 98 %.   Gen. Pleasant, well-nourished, in no distress, normal affect   HEENT: [place]/AT, face symmetric, no scleral icterus, PERRLA, nares patent without drainage, pharynx with post-nasal drainage, no erythema or exudate.  TMs full b/l.  No cervical lymphadenopathy.  TTP of R maxillary sinus>L, and frontal sinuses. Lungs: no accessory muscle use, CTAB, no wheezes or rales Cardiovascular: RRR, no m/r/g, no peripheral edema Neuro:  A&Ox3, CN II-XII intact,  normal gait  Wt Readings from Last 3 Encounters:  03/28/18 173 lb (78.5 kg)  03/08/18 177 lb (80.3 kg)  01/23/18 171 lb (77.6 kg)    No results found for: WBC, HGB, HCT, PLT, GLUCOSE, CHOL, TRIG, HDL, LDLDIRECT, LDLCALC, ALT, AST, NA, K, CL, CREATININE, BUN, CO2, TSH, PSA, INR, GLUF, HGBA1C, MICROALBUR  Assessment/Plan:  Acute maxillary sinusitis, recurrence not specified  -given handout -continue flonase, claritin - Plan: amoxicillin (AMOXIL) 500 MG capsule  F/u prn  Grier Mitts, MD

## 2018-03-29 ENCOUNTER — Other Ambulatory Visit: Payer: Self-pay | Admitting: Family Medicine

## 2018-04-01 MED FILL — VENLAFAXINE HCL ER 75 MG CA: 75 | 90 days supply | Qty: 90 | Fill #0

## 2018-04-09 ENCOUNTER — Other Ambulatory Visit: Payer: Self-pay | Admitting: Family Medicine

## 2018-04-09 DIAGNOSIS — J0100 Acute maxillary sinusitis, unspecified: Secondary | ICD-10-CM

## 2018-04-11 ENCOUNTER — Telehealth: Payer: No Typology Code available for payment source | Admitting: Family

## 2018-04-11 ENCOUNTER — Ambulatory Visit: Payer: No Typology Code available for payment source | Admitting: Podiatry

## 2018-04-11 ENCOUNTER — Other Ambulatory Visit: Payer: Self-pay | Admitting: Family Medicine

## 2018-04-11 ENCOUNTER — Telehealth: Payer: Self-pay | Admitting: *Deleted

## 2018-04-11 DIAGNOSIS — J45909 Unspecified asthma, uncomplicated: Secondary | ICD-10-CM

## 2018-04-11 DIAGNOSIS — J209 Acute bronchitis, unspecified: Secondary | ICD-10-CM

## 2018-04-11 MED ORDER — ALBUTEROL SULFATE HFA 108 (90 BASE) MCG/ACT IN AERS: 2.0000 | INHALATION_SPRAY | Freq: Four times a day (QID) | RESPIRATORY_TRACT | 0 refills | Status: AC | PRN

## 2018-04-11 MED ORDER — BENZONATATE 100 MG PO CAPS: 100.0000 mg | ORAL_CAPSULE | Freq: Two times a day (BID) | ORAL | 0 refills | Status: DC | PRN

## 2018-04-11 MED FILL — ALBUTEROL SULFATE HFA 108 (: 108 (90 BAS | 25 days supply | Qty: 18 | Fill #0

## 2018-04-11 NOTE — Telephone Encounter (Signed)
Patient is a Furniture conservator/restorer. Patient did her health screening and called health at work. Patient was sent home d/t a dry cough. Patient seen you on 3/12 dx with sinus infection rx'd Amoxicillin, and advised to take flonase, claritin and sudafed. Patient reports she has been taking that. Patient was rx'd an inhaler today via telemedicine. Patient reports the cough is causing her chest to hurt and become tight. Please advise if we can add something to her regimen to help with the cough. Also can she be cleared to go back to work?

## 2018-04-11 NOTE — Telephone Encounter (Signed)
Tessalon sent to pharmacy

## 2018-04-11 NOTE — Progress Notes (Signed)
E Visit for Asthma  Based on what you have shared with me, it looks like you may have a flare up of your asthma.  Asthma is a chronic (ongoing) lung disease which results in airway obstruction, inflammation and hyper-responsiveness.   Asthma symptoms vary from person to person, with common symptoms including nighttime awakening and decreased ability to participate in normal activities as a result of shortness of breath. It is often triggered by changes in weather, changes in the season, changes in air temperature, or inside (home, school, daycare or work) allergens such as animal dander, mold, mildew, woodstoves or cockroaches.   It can also be triggered by hormonal changes, extreme emotion, physical exertion or an upper respiratory tract illness.     It is important to identify the trigger, and then eliminate or avoid the trigger if possible.   If you have been prescribed medications to be taken on a regular basis, it is important to follow the asthma action plan and to follow guidelines to adjust medication in response to increasing symptoms of decreased peak expiratory flow rate  Treatment: I have prescribed: Albuterol (Proventil HFA; Ventolin HFA) 108 (90 Base) MCG/ACT Inhaler 2 puffs into the lungs every six hours as needed for wheezing or shortness of breath  HOME CARE . Only take medications as instructed by your medical team. . Consider wearing a mask or scarf to improve breathing air temperature have been shown to decrease irritation and decrease exacerbations . Get rest. . Taking a steamy shower or using a humidifier may help nasal congestion sand ease sore throat pain. You can place a towel over your head and breathe in the steam from hot water coming from a faucet. . Using a saline nasal spray works much the same way.  . Cough drops, hare candies and sore throat lozenges may  ease your cough.  . Avoid close contacts especially the very you and the elderly . Cover your mouth if you cough or sneeze . Always remember to wash your hands.    GET HELP RIGHT AWAY IF: . You develop worsening symptoms; breathlessness at rest, drowsy, confused or agitated, unable to speak in full sentences . You have coughing fits . You develop a severe headache or visual changes . You develop shortness of breath, difficulty breathing or start having chest pain . Your symptoms persist after you have completed your treatment plan . If your symptoms do not improve within 10 days  MAKE SURE YOU . Understand these instructions. . Will watch your condition. . Will get help right away if you are not doing well or get worse.   Your e-visit answers were reviewed by a board certified advanced clinical practitioner to complete your personal care plan, Depending upon the condition, your plan could have included both over the counter or prescription medications.  Please review your pharmacy choice. Your safety is important to us. If you have drug allergies check your prescription carefully. You can use MyChart to ask questions about today's visit, request a non-urgent call back, or ask for a work or school excuse for 24 hours related to this e-Visit. If it has been greater than 24 hours you will need to follow up with your provider, or enter a new e-Visit to address those concerns.  You will get an e-mail in the next two days asking about your experience. I hope that your e-visit has been valuable and will speed your recovery. Thank you for using e-visits. 

## 2018-04-12 ENCOUNTER — Telehealth: Payer: Self-pay | Admitting: *Deleted

## 2018-04-12 NOTE — Telephone Encounter (Signed)
Pt has already been scheduled to see an ENT

## 2018-04-12 NOTE — Telephone Encounter (Signed)
Copied from [place] [id number]. Topic: Referral - Status >> Apr 12, 2018  8:55 AM Reyne Dumas L wrote: Reason for CRM:   Pt called and left message on [facility] mailbox 04/11/2018.  Pt states she is returning a call about an ENT referral.  Pt can be reached at [phone] or [phone]. >> Apr 12, 2018  9:15 AM Rayann Heman wrote: Pt called back and stated that this was about an ENT referral. Please advise

## 2018-04-21 ENCOUNTER — Telehealth: Payer: No Typology Code available for payment source | Admitting: Family

## 2018-04-21 DIAGNOSIS — R0602 Shortness of breath: Secondary | ICD-10-CM

## 2018-04-21 NOTE — Progress Notes (Signed)
Based on what you shared with me, I feel your condition warrants further evaluation and I recommend that you be seen for a face to face office visit.  Given your symptoms of shortness of breath you need to be evaluated today.     NOTE: If you entered your credit card information for this eVisit, you will not be charged. You may see a "hold" on your card for the $35 but that hold will drop off and you will not have a charge processed.  If you are having a true medical emergency please call 911.  If you need an urgent face to face visit, [facility] has four urgent care centers for your convenience.    PLEASE NOTE: THE [facility]ACARE LOCATIONS AND URGENT CARE CLINICS DO NOT HAVE THE TESTING FOR CORONAVIRUS COVID19 AVAILABLE.  IF YOU FEEL YOU NEED THIS TEST YOU MUST GO TO A TRIAGE LOCATION AT [place]   [email or web address] to reserve your spot online an avoid wait times  [facility] [address] [place], [place] [address] Modified hours of operation: Monday-Friday, 10 AM to 6 PM  Saturday & Sunday 10 AM to 4 PM *Across the street from [facility] (New Address!) [address] [place], [place] [address] *Just off [address], across the road from [place] hours of operation: Monday-Friday, 10 AM to 5 PM  Closed Saturday & Sunday   The following sites will take your insurance:  . [facility] Urgent [place] a Provider at this Location  [address] [place], [place] [address] . 10 am to 8 pm Monday-Friday . 12 pm to 8 pm Saturday-Sunday   . [facility] at [place] a Provider at this Location  [address], [place], [place] [address] . 8 am to 8 pm Monday-Friday . 9 am to 6 pm Saturday . 11 am to 6 pm Sunday   . [facility] Urgent  Care at [place] Get Driving Directions  [address] [place], [place] [address] . 8 am to 8 pm Monday-Friday . 8 am to 4 pm Saturday-Sunday   Your e-visit answers were reviewed by a board certified advanced clinical practitioner to complete your personal care plan.  Thank you for using e-Visits.

## 2018-04-25 ENCOUNTER — Encounter: Payer: Self-pay | Admitting: Family Medicine

## 2018-04-25 ENCOUNTER — Ambulatory Visit (INDEPENDENT_AMBULATORY_CARE_PROVIDER_SITE_OTHER): Payer: No Typology Code available for payment source | Admitting: Family Medicine

## 2018-04-25 ENCOUNTER — Other Ambulatory Visit: Payer: Self-pay

## 2018-04-25 ENCOUNTER — Ambulatory Visit: Payer: Self-pay

## 2018-04-25 DIAGNOSIS — R002 Palpitations: Secondary | ICD-10-CM

## 2018-04-25 DIAGNOSIS — Z8709 Personal history of other diseases of the respiratory system: Secondary | ICD-10-CM | POA: Diagnosis not present

## 2018-04-25 DIAGNOSIS — R0789 Other chest pain: Secondary | ICD-10-CM

## 2018-04-25 MED ORDER — PREDNISONE 20 MG PO TABS: 40.0000 mg | ORAL_TABLET | Freq: Every day | ORAL | 0 refills | Status: AC

## 2018-04-25 NOTE — Progress Notes (Signed)
Virtual Visit via Video Note  I connected with Jacqueline Sims on 04/25/18 at 11:00 AM EDT by a video enabled telemedicine application and verified that I am speaking with the correct person using two identifiers.  Location patient: home Location provider:home office Persons participating in the virtual visit: patient, provider  I discussed the limitations of evaluation and management by telemedicine and the availability of in person appointments. The patient expressed understanding and agreed to proceed.   HPI: Pt with dry cough and SOB since sat that has resolved.  Now with palpitations and chest tightness.  States is more aware of her heart beating especially when laying on L side at night.  Used inhaler and dayquil on Sunday and Monday. Endorses having postnasal drip 2/2 allergies and decreased appetite.  Eating soup and drinking water.  Had a HA when symptoms started, now resolved.  Denies fever, ear pain/pressure, sore throat, n/v, diarrhea.  Has a h/o heart burn but notes the chest tightness feels different.  Pt also notes pulse increased from 80 to 1teens to 120s with standing per her apple watch. Unsure of sick contacts, is a nurse at the hospital.    Pt had e-visit 04/11/18 for bronchitis with asthma.   ROS: See pertinent positives and negatives per HPI.  Past Medical History:  Diagnosis Date  . Arthritis   . GERD (gastroesophageal reflux disease)   . Hay fever     Past Surgical History:  Procedure Laterality Date  . FLAT FOOT CORRECTION Right 2015    No family history on file.  SOCIAL HX: Pt is a Marine scientist at [employer].   Current Outpatient Medications:  .  albuterol (PROVENTIL HFA;VENTOLIN HFA) 108 (90 Base) MCG/ACT inhaler, Inhale 2 puffs into the lungs every 6 (six) hours as needed for wheezing or shortness of breath., Disp: 1 Inhaler, Rfl: 0 .  azelastine (ASTELIN) 0.1 % nasal spray, Place 1 spray into both nostrils daily., Disp: 30 mL, Rfl: 4 .  benzonatate  (TESSALON) 100 MG capsule, Take 1 capsule (100 mg total) by mouth 2 (two) times daily as needed for cough., Disp: 20 capsule, Rfl: 0 .  fluticasone (FLONASE) 50 MCG/ACT nasal spray, Place into the nose., Disp: , Rfl:  .  loratadine (CLARITIN) 10 MG tablet, Take 10 mg by mouth daily., Disp: , Rfl:  .  norgestimate-ethinyl estradiol (ORTHO-CYCLEN,SPRINTEC,PREVIFEM) 0.25-35 MG-MCG tablet, Take by mouth., Disp: , Rfl:   EXAM:  VITALS per patient if applicable:  rr between 12-20 bpm  GENERAL: alert, oriented, appears well and in no acute distress  HEENT: atraumatic, conjunttiva clear, no obvious abnormalities on inspection of external nose and ears  NECK: normal movements of the head and neck  LUNGS: on inspection no signs of respiratory distress, breathing rate appears normal, no obvious gross SOB, gasping or wheezing  CV: no obvious cyanosis  MS: moves all visible extremities without noticeable abnormality  PSYCH/NEURO: pleasant and cooperative, no obvious depression or anxiety, speech and thought processing grossly intact  ASSESSMENT AND PLAN:  Discussed the following assessment and plan:  Chest tightness  -discussed possible causes including asthma exacerbation, bronchitis, GERD, cardiac cause, MSK, etc -continue albuterol inhaler prn -will start prednisone -given precautions for worsening symptoms. - Plan: predniSONE (DELTASONE) 20 MG tablet  History of asthma -continue albuterol inhaler prn  Palpitations -improving -pt to limit caffeine intake -for continued symptoms will obtain labs including CBC, TSH, free T4, BMP. -if symptoms continue discussed referral to Cards for Holter monitor or event monitor.  I discussed the assessment and treatment plan with the patient. The patient was provided an opportunity to ask questions and all were answered. The patient agreed with the plan and demonstrated an understanding of the instructions.   The patient was advised to call back  or seek an in-person evaluation if the symptoms worsen or if the condition fails to improve as anticipated.   Billie Ruddy, MD

## 2018-04-25 NOTE — Telephone Encounter (Signed)
Pt was seen by dr Volanda Napoleon this morning.

## 2018-04-25 NOTE — Telephone Encounter (Signed)
Incoming call from Patient with complaint of chest tightness.  Patient states that it feels like someone is sitting on my chest.  Patient states that  Onset was Saturday.  Experienced  Respiratory  Issues then too.  Respiratory issues have improved.  Denies radiation.  Patient states that pattern is constant.  Rates it mild.  Reports that it more of a discomfort.  Denies cardiac history.  Has a hx.  Of asthma.  Patient states a little out of breath after climbing stairs.   Reason for Disposition . [1] Chest pain lasts > 5 minutes AND [2] occurred > 3 days ago (72 hours) AND [3] NO chest pain or cardiac symptoms now  Answer Assessment - Initial Assessment Questions 1. LOCATION: "Where does it hurt?"       middle 2. RADIATION: "Does the pain go anywhere else?" (e.g., into neck, jaw, arms, back)     denies 3. ONSET: "When did the chest pain begin?" (Minutes, hours or days)      Saturday 4. PATTERN "Does the pain come and go, or has it been constant since it started?"  "Does it get worse with exertion?"      constant 5. DURATION: "How long does it last" (e.g., seconds, minutes, hours)     *No Answer* 6. SEVERITY: "How bad is the pain?"  (e.g., Scale 1-10; mild, moderate, or severe)    - MILD (1-3): doesn't interfere with normal activities     - MODERATE (4-7): interferes with normal activities or awakens from sleep    - SEVERE (8-10): excruciating pain, unable to do any normal activities       Mild  Not really a pain,  Uncomfortable 7. CARDIAC RISK FACTORS: "Do you have any history of heart problems or risk factors for heart disease?" (e.g., prior heart attack, angina; high blood pressure, diabetes, being overweight, high cholesterol, smoking, or strong family history of heart disease)    denies 8. PULMONARY RISK FACTORS: "Do you have any history of lung disease?"  (e.g., blood clots in lung, asthma, emphysema, birth control pills)     asthma 9. CAUSE: "What do you think is causing the chest  pain?"     *No Answer* 10. OTHER SYMPTOMS: "Do you have any other symptoms?" (e.g., dizziness, nausea, vomiting, sweating, fever, difficulty breathing, cough)      A little  Out of breath when climbing stairs once sitting down it goes away.   11. PREGNANCY: "Is there any chance you are pregnant?" "When was your last menstrual period?"        Last week.  Protocols used: CHEST PAIN-A-AH

## 2018-05-15 MED FILL — FLUoxetine HCL 20 MG CAPS: 20 | 30 days supply | Qty: 30 | Fill #0

## 2018-05-15 MED FILL — VENLAFAXINE HCL ER 37.5 MG: 37.5 | 30 days supply | Qty: 30 | Fill #0

## 2018-06-03 DIAGNOSIS — N946 Dysmenorrhea, unspecified: Secondary | ICD-10-CM | POA: Insufficient documentation

## 2018-06-03 DIAGNOSIS — K589 Irritable bowel syndrome without diarrhea: Secondary | ICD-10-CM | POA: Insufficient documentation

## 2018-06-03 MED FILL — ALYACEN 1-35-28 TABLET: 1-35 | 84 days supply | Qty: 84 | Fill #0

## 2018-06-11 MED FILL — FLUoxetine HCL 20 MG CAPS: 20 | 30 days supply | Qty: 30 | Fill #1

## 2018-06-12 MED FILL — traMADol HCL 50 MG TABS: 50 | 7 days supply | Qty: 30 | Fill #0

## 2018-06-13 ENCOUNTER — Other Ambulatory Visit: Payer: Self-pay

## 2018-06-13 ENCOUNTER — Observation Stay (HOSPITAL_COMMUNITY)
Admission: EM | Admit: 2018-06-13 | Discharge: 2018-06-15 | Disposition: A | Discharge status: Home / Self Care | Payer: No Typology Code available for payment source | Attending: Internal Medicine | Admitting: Internal Medicine

## 2018-06-13 ENCOUNTER — Encounter (HOSPITAL_COMMUNITY): Payer: Self-pay | Admitting: Emergency Medicine

## 2018-06-13 DIAGNOSIS — R292 Abnormal reflex: Principal | ICD-10-CM | POA: Diagnosis present

## 2018-06-13 DIAGNOSIS — R7989 Other specified abnormal findings of blood chemistry: Secondary | ICD-10-CM | POA: Insufficient documentation

## 2018-06-13 DIAGNOSIS — D473 Essential (hemorrhagic) thrombocythemia: Secondary | ICD-10-CM | POA: Diagnosis present

## 2018-06-13 DIAGNOSIS — R258 Other abnormal involuntary movements: Secondary | ICD-10-CM | POA: Insufficient documentation

## 2018-06-13 DIAGNOSIS — F419 Anxiety disorder, unspecified: Secondary | ICD-10-CM | POA: Insufficient documentation

## 2018-06-13 DIAGNOSIS — Z79899 Other long term (current) drug therapy: Secondary | ICD-10-CM | POA: Diagnosis not present

## 2018-06-13 DIAGNOSIS — R29898 Other symptoms and signs involving the musculoskeletal system: Secondary | ICD-10-CM | POA: Insufficient documentation

## 2018-06-13 DIAGNOSIS — Z7951 Long term (current) use of inhaled steroids: Secondary | ICD-10-CM | POA: Diagnosis not present

## 2018-06-13 DIAGNOSIS — R260 Ataxic gait: Secondary | ICD-10-CM | POA: Insufficient documentation

## 2018-06-13 DIAGNOSIS — D72829 Elevated white blood cell count, unspecified: Secondary | ICD-10-CM | POA: Diagnosis present

## 2018-06-13 DIAGNOSIS — Z1159 Encounter for screening for other viral diseases: Secondary | ICD-10-CM | POA: Diagnosis not present

## 2018-06-13 DIAGNOSIS — G988 Other disorders of nervous system: Secondary | ICD-10-CM

## 2018-06-13 DIAGNOSIS — R21 Rash and other nonspecific skin eruption: Secondary | ICD-10-CM | POA: Diagnosis present

## 2018-06-13 DIAGNOSIS — R251 Tremor, unspecified: Secondary | ICD-10-CM | POA: Insufficient documentation

## 2018-06-13 DIAGNOSIS — M1990 Unspecified osteoarthritis, unspecified site: Secondary | ICD-10-CM | POA: Diagnosis not present

## 2018-06-13 DIAGNOSIS — R2681 Unsteadiness on feet: Secondary | ICD-10-CM | POA: Diagnosis not present

## 2018-06-13 DIAGNOSIS — F329 Major depressive disorder, single episode, unspecified: Secondary | ICD-10-CM | POA: Diagnosis not present

## 2018-06-13 DIAGNOSIS — E876 Hypokalemia: Secondary | ICD-10-CM | POA: Diagnosis not present

## 2018-06-13 DIAGNOSIS — J45909 Unspecified asthma, uncomplicated: Secondary | ICD-10-CM | POA: Diagnosis not present

## 2018-06-13 DIAGNOSIS — Z820 Family history of epilepsy and other diseases of the nervous system: Secondary | ICD-10-CM | POA: Diagnosis not present

## 2018-06-13 DIAGNOSIS — R2689 Other abnormalities of gait and mobility: Secondary | ICD-10-CM | POA: Diagnosis not present

## 2018-06-13 DIAGNOSIS — D509 Iron deficiency anemia, unspecified: Secondary | ICD-10-CM | POA: Diagnosis not present

## 2018-06-13 DIAGNOSIS — R270 Ataxia, unspecified: Secondary | ICD-10-CM

## 2018-06-13 DIAGNOSIS — D75839 Thrombocytosis, unspecified: Secondary | ICD-10-CM | POA: Diagnosis present

## 2018-06-13 DIAGNOSIS — M545 Low back pain: Secondary | ICD-10-CM | POA: Insufficient documentation

## 2018-06-13 DIAGNOSIS — M6281 Muscle weakness (generalized): Secondary | ICD-10-CM | POA: Insufficient documentation

## 2018-06-13 HISTORY — DX: Unspecified asthma, uncomplicated: J45.909

## 2018-06-13 NOTE — ED Triage Notes (Signed)
Patient reports she took a tramadol around 530 pm this evening, when she awoke around 9pm she reports attempting to stretch her legs and they both began spasming and twitching. She reports that symptoms have been ongoing since. States that her legs are sore now. resp e/u, nad.

## 2018-06-14 ENCOUNTER — Observation Stay (HOSPITAL_COMMUNITY): Payer: No Typology Code available for payment source

## 2018-06-14 ENCOUNTER — Encounter (HOSPITAL_COMMUNITY): Payer: Self-pay | Admitting: Internal Medicine

## 2018-06-14 DIAGNOSIS — D473 Essential (hemorrhagic) thrombocythemia: Secondary | ICD-10-CM | POA: Diagnosis not present

## 2018-06-14 DIAGNOSIS — D75839 Thrombocytosis, unspecified: Secondary | ICD-10-CM | POA: Diagnosis present

## 2018-06-14 DIAGNOSIS — E876 Hypokalemia: Secondary | ICD-10-CM

## 2018-06-14 DIAGNOSIS — J4520 Mild intermittent asthma, uncomplicated: Secondary | ICD-10-CM

## 2018-06-14 DIAGNOSIS — R292 Abnormal reflex: Secondary | ICD-10-CM | POA: Diagnosis present

## 2018-06-14 DIAGNOSIS — D509 Iron deficiency anemia, unspecified: Secondary | ICD-10-CM | POA: Diagnosis not present

## 2018-06-14 DIAGNOSIS — D72829 Elevated white blood cell count, unspecified: Secondary | ICD-10-CM

## 2018-06-14 DIAGNOSIS — J45909 Unspecified asthma, uncomplicated: Secondary | ICD-10-CM | POA: Diagnosis present

## 2018-06-14 DIAGNOSIS — R21 Rash and other nonspecific skin eruption: Secondary | ICD-10-CM | POA: Diagnosis present

## 2018-06-14 LAB — SARS CORONAVIRUS 2 BY RT PCR (HOSPITAL ORDER, PERFORMED IN ~~LOC~~ HOSPITAL LAB): SARS Coronavirus 2: NEGATIVE

## 2018-06-14 LAB — CBC WITH DIFFERENTIAL/PLATELET
Abs Immature Granulocytes: 0.03 10*3/uL (ref 0.00–0.07)
Basophils Absolute: 0 10*3/uL (ref 0.0–0.1)
Basophils Relative: 0 %
Eosinophils Absolute: 0.2 10*3/uL (ref 0.0–0.5)
Eosinophils Relative: 1 %
HCT: 34.1 % — ABNORMAL LOW (ref 36.0–46.0)
Hemoglobin: 10.7 g/dL — ABNORMAL LOW (ref 12.0–15.0)
Immature Granulocytes: 0 %
Lymphocytes Relative: 34 %
Lymphs Abs: 4.8 10*3/uL — ABNORMAL HIGH (ref 0.7–4.0)
MCH: 24.3 pg — ABNORMAL LOW (ref 26.0–34.0)
MCHC: 31.4 g/dL (ref 30.0–36.0)
MCV: 77.5 fL — ABNORMAL LOW (ref 80.0–100.0)
Monocytes Absolute: 0.8 10*3/uL (ref 0.1–1.0)
Monocytes Relative: 6 %
Neutro Abs: 8.3 10*3/uL — ABNORMAL HIGH (ref 1.7–7.7)
Neutrophils Relative %: 59 %
Platelets: 465 10*3/uL — ABNORMAL HIGH (ref 150–400)
RBC: 4.4 MIL/uL (ref 3.87–5.11)
RDW: 14.9 % (ref 11.5–15.5)
WBC: 14.2 10*3/uL — ABNORMAL HIGH (ref 4.0–10.5)
nRBC: 0 % (ref 0.0–0.2)

## 2018-06-14 LAB — VITAMIN B12: Vitamin B-12: 285 pg/mL (ref 180–914)

## 2018-06-14 LAB — I-STAT BETA HCG BLOOD, ED (MC, WL, AP ONLY): I-stat hCG, quantitative: <5 m[IU]/mL (ref <5)

## 2018-06-14 LAB — COMPREHENSIVE METABOLIC PANEL
ALT: 17 U/L (ref 0–44)
AST: 17 U/L (ref 15–41)
Albumin: 3.3 g/dL — ABNORMAL LOW (ref 3.5–5.0)
Alkaline Phosphatase: 94 U/L (ref 38–126)
Anion gap: 10 (ref 5–15)
BUN: 10 mg/dL (ref 6–20)
CO2: 22 mmol/L (ref 22–32)
Calcium: 9.1 mg/dL (ref 8.9–10.3)
Chloride: 106 mmol/L (ref 98–111)
Creatinine, Ser: 0.82 mg/dL (ref 0.44–1.00)
GFR calc Af Amer: >60 mL/min (ref >60)
GFR calc non Af Amer: >60 mL/min (ref >60)
Glucose, Bld: 111 mg/dL — ABNORMAL HIGH (ref 70–99)
Potassium: 3.4 mmol/L — ABNORMAL LOW (ref 3.5–5.1)
Sodium: 138 mmol/L (ref 135–145)
Total Bilirubin: 0.3 mg/dL (ref 0.3–1.2)
Total Protein: 7.2 g/dL (ref 6.5–8.1)

## 2018-06-14 LAB — C-REACTIVE PROTEIN: CRP: 2.6 mg/dL — ABNORMAL HIGH (ref <1.0)

## 2018-06-14 LAB — MAGNESIUM: Magnesium: 2.1 mg/dL (ref 1.7–2.4)

## 2018-06-14 LAB — URINALYSIS, ROUTINE W REFLEX MICROSCOPIC
Bilirubin Urine: NEGATIVE
Glucose, UA: NEGATIVE mg/dL
Hgb urine dipstick: NEGATIVE
Ketones, ur: NEGATIVE mg/dL
Leukocytes,Ua: NEGATIVE
Nitrite: NEGATIVE
Protein, ur: NEGATIVE mg/dL
Specific Gravity, Urine: 1.011 (ref 1.005–1.030)
pH: 5 (ref 5.0–8.0)

## 2018-06-14 LAB — TSH: TSH: 5.134 u[IU]/mL — ABNORMAL HIGH (ref 0.350–4.500)

## 2018-06-14 LAB — T4, FREE: Free T4: 0.92 ng/dL (ref 0.82–1.77)

## 2018-06-14 LAB — PHOSPHORUS: Phosphorus: 3.9 mg/dL (ref 2.5–4.6)

## 2018-06-14 LAB — SEDIMENTATION RATE: Sed Rate: 31 mm/hr — ABNORMAL HIGH (ref 0–22)

## 2018-06-14 LAB — CK: Total CK: 129 U/L (ref 38–234)

## 2018-06-14 MED ORDER — ALBUTEROL SULFATE (2.5 MG/3ML) 0.083% IN NEBU: 2.5000 mg | INHALATION_SOLUTION | Freq: Four times a day (QID) | RESPIRATORY_TRACT | Status: DC | PRN

## 2018-06-14 MED ORDER — NORGESTIMATE-ETH ESTRADIOL 0.25-35 MG-MCG PO TABS: 1.0000 | ORAL_TABLET | Freq: Every day | ORAL | Status: DC

## 2018-06-14 MED ORDER — SODIUM CHLORIDE 0.9% FLUSH
3.0000 mL | Freq: Two times a day (BID) | INTRAVENOUS | Status: DC
  Administered 2018-06-14 (×2): 3 mL via INTRAVENOUS

## 2018-06-14 MED ORDER — ACETAMINOPHEN 325 MG PO TABS
650.0000 mg | ORAL_TABLET | Freq: Four times a day (QID) | ORAL | Status: DC | PRN
  Administered 2018-06-14: 650 mg via ORAL
  Filled 2018-06-14: qty 2

## 2018-06-14 MED ORDER — ACETAMINOPHEN 650 MG RE SUPP: 650.0000 mg | Freq: Four times a day (QID) | RECTAL | Status: DC | PRN

## 2018-06-14 MED ORDER — LORAZEPAM 2 MG/ML IJ SOLN: 0.5000 mg | INTRAMUSCULAR | Status: DC | PRN

## 2018-06-14 MED ORDER — ENOXAPARIN SODIUM 40 MG/0.4ML ~~LOC~~ SOLN
40.0000 mg | SUBCUTANEOUS | Status: DC
  Administered 2018-06-14 – 2018-06-15 (×2): 40 mg via SUBCUTANEOUS
  Filled 2018-06-14 (×2): qty 0.4

## 2018-06-14 MED ORDER — DIPHENHYDRAMINE HCL 25 MG PO CAPS
25.0000 mg | ORAL_CAPSULE | Freq: Once | ORAL | Status: AC
  Administered 2018-06-14: 25 mg via ORAL
  Filled 2018-06-14: qty 1

## 2018-06-14 MED ORDER — GADOBUTROL 1 MMOL/ML IV SOLN
7.0000 mL | Freq: Once | INTRAVENOUS | Status: AC | PRN
  Administered 2018-06-14: 7 mL via INTRAVENOUS

## 2018-06-14 MED ORDER — POTASSIUM CHLORIDE CRYS ER 20 MEQ PO TBCR
20.0000 meq | EXTENDED_RELEASE_TABLET | ORAL | Status: AC
  Administered 2018-06-14: 20 meq via ORAL
  Filled 2018-06-14: qty 1

## 2018-06-14 MED ORDER — METHOCARBAMOL 500 MG PO TABS
750.0000 mg | ORAL_TABLET | Freq: Once | ORAL | Status: AC
  Administered 2018-06-14: 750 mg via ORAL
  Filled 2018-06-14: qty 2

## 2018-06-14 MED ORDER — SODIUM CHLORIDE 0.9 % IV BOLUS
1000.0000 mL | Freq: Once | INTRAVENOUS | Status: AC
  Administered 2018-06-14: 1000 mL via INTRAVENOUS

## 2018-06-14 MED ORDER — DIAZEPAM 5 MG PO TABS
10.0000 mg | ORAL_TABLET | Freq: Once | ORAL | Status: AC
  Administered 2018-06-14: 10 mg via ORAL
  Filled 2018-06-14: qty 2

## 2018-06-14 MED ORDER — SODIUM CHLORIDE 0.9 % IV SOLN
INTRAVENOUS | Status: DC
  Administered 2018-06-14 – 2018-06-15 (×2): via INTRAVENOUS

## 2018-06-14 NOTE — Evaluation (Signed)
Physical Therapy Evaluation Patient Details Name: Jacqueline Sims MRN: [id number] DOB: 1990-11-21 Today's Date: 06/14/2018   History of Present Illness  Pt is a 28 y/o female admitted secondary to hyperreflexia of LEs. Workup pending and pt awaiting further imaging. PMH includes asthma, anxiety, and depression.   Clinical Impression  Pt admitted secondary to problem above with deficits below. Pt with severe jerking type motions in RUE and BLE (RLE>LLE). Pt requiring min A to stand and to take side steps at EOB. Feel pt will require chair follow for further mobility given severity of jerking type motions. Pt may progress well if ataxia improves, however, will continue to follow acutely and update recommendations according to pt progress.     Follow Up Recommendations Supervision for mobility/OOB(TBD pending pt progression)    Equipment Recommendations  Rolling walker with 5" wheels    Recommendations for Other Services OT consult     Precautions / Restrictions Precautions Precautions: Fall Restrictions Weight Bearing Restrictions: No      Mobility  Bed Mobility Overal bed mobility: Modified Independent                Transfers Overall transfer level: Needs assistance Equipment used: Rolling walker (2 wheeled) Transfers: Sit to/from Stand Sit to Stand: Min assist         General transfer comment: Min A for lift assist and steadying assist. Jerking type movements in BLE (RLE>LLE) during transition. Upon standing, however, seemed to calm.   Ambulation/Gait Ambulation/Gait assistance: Min assist   Assistive device: Rolling walker (2 wheeled) Gait Pattern/deviations: Ataxic Gait velocity: Decreased    General Gait Details: Performed side steps at EOB. Pt with increased jerking type movements in RLE upon taking steps to the R. Required min a for steadying, as pt able to hold herself up with UEs. Further mobility deferred as pt will likely need chair follow for  safety.   Stairs            Wheelchair Mobility    Modified Rankin (Stroke Patients Only)       Balance Overall balance assessment: Needs assistance Sitting-balance support: No upper extremity supported;Feet supported Sitting balance-Leahy Scale: Fair     Standing balance support: Bilateral upper extremity supported;During functional activity Standing balance-Leahy Scale: Poor Standing balance comment: Heavy reliance on UE support                              Pertinent Vitals/Pain Pain Assessment: No/denies pain    Home Living Family/patient expects to be discharged to:: Private residence Living Arrangements: Alone Available Help at Discharge: Family;Available 24 hours/day Type of Home: Other(Comment)(townhome) Home Access: Level entry     Home Layout: Two level Home Equipment: None Additional Comments: Pt reports her mother is flying into town to stay with her.     Prior Function Level of Independence: Independent         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[employer]        Extremity/Trunk Assessment   Upper Extremity Assessment Upper Extremity Assessment: Defer to OT evaluation;RUE deficits/detail RUE Deficits / Details: Noted jerking type movements in RUE with purposeful movement. Would calm at rest.    Lower Extremity Assessment Lower Extremity Assessment: RLE deficits/detail;LLE deficits/detail RLE Deficits / Details: Jerking type movements noted with active movement. RLE greater than LLE. Seemed to calm at rest.  LLE Deficits / Details: Jerking type movements noted  with active movement. Much less notable than in RLE. Seemed to calm at rest.     Cervical / Trunk Assessment Cervical / Trunk Assessment: Normal  Communication   Communication: No difficulties  Cognition Arousal/Alertness: Awake/alert Behavior During Therapy: WFL for tasks assessed/performed Overall Cognitive Status: Within Functional Limits for tasks  assessed                                        General Comments      Exercises     Assessment/Plan    PT Assessment Patient needs continued PT services  PT Problem List Decreased balance;Decreased mobility;Decreased coordination;Decreased knowledge of use of DME;Decreased knowledge of precautions       PT Treatment Interventions DME instruction;Gait training;Functional mobility training;Stair training;Therapeutic activities;Therapeutic exercise;Balance training;Patient/family education    PT Goals (Current goals can be found in the Care Plan section)  Acute Rehab PT Goals Patient Stated Goal: to get back to normal PT Goal Formulation: With patient Time For Goal Achievement: 06/28/18 Potential to Achieve Goals: Good    Frequency Min 3X/week   Barriers to discharge        Co-evaluation               AM-PAC PT "6 Clicks" Mobility  Outcome Measure Help needed turning from your back to your side while in a flat bed without using bedrails?: None Help needed moving from lying on your back to sitting on the side of a flat bed without using bedrails?: None Help needed moving to and from a bed to a chair (including a wheelchair)?: A Lot Help needed standing up from a chair using your arms (e.g., wheelchair or bedside chair)?: A Little Help needed to walk in hospital room?: A Lot Help needed climbing 3-5 steps with a railing? : A Lot 6 Click Score: 17    End of Session Equipment Utilized During Treatment: Gait belt Activity Tolerance: Patient tolerated treatment well Patient left: in bed;with call bell/phone within reach Nurse Communication: Mobility status PT Visit Diagnosis: Unsteadiness on feet (R26.81);Muscle weakness (generalized) (M62.81);Ataxic gait (R26.0)    Time: 1505-6979 PT Time Calculation (min) (ACUTE ONLY): 15 min   Charges:   PT Evaluation $PT Eval Moderate Complexity: 1 Mod          Leighton Ruff, PT, DPT  Acute  Rehabilitation Services  Pager: [phone] Office: [phone]   Rudean Hitt 06/14/2018, 4:44 PM

## 2018-06-14 NOTE — ED Provider Notes (Signed)
Medical screening examination/treatment/procedure(s) were conducted as a shared visit with non-physician practitioner(s) and myself.  I personally evaluated the patient during the encounter.  None I have evaluated the patient and assumed care from PA-C [place].  Patient has atypical ataxic type gait.  He is able to bear weight symmetrically but has gross motor spasmodic movement of both lower extremities in weightbearing position.  Patient has hyperreflexic reflexes symmetrically upper and lower testing.  Symmetric and consensual pupillary response.  Normal cognitive function.  Normal speech.  I reviewed history of present illness with the patient including no known exposure to pesticides or other chemical exposures likely to cause toxidrome.  He is tapering Prozac and took 1 tramadol last night.  No other significant ingestion.  No history of similar episodes.  Dr. Cheral Marker neurology consulted.  He has evaluated the patient and find a typical spasmodic/ataxic findings that will necessitate MRI of brain and entire spine.  Plan for admission to hospitalist service for PT and ongoing medical management with neurology consulting.   Charlesetta Shanks, MD 06/14/18 [phone]

## 2018-06-14 NOTE — H&P (Addendum)
History and Physical    Jacqueline Sims YQM:[id number] DOB: July 15, 1990 DOA: 06/13/2018  Referring MD/NP/PA: Lissa Morales, MD PCP: Billie Ruddy, MD  Patient coming from: Home  Chief Complaint: Leg spasms  I have personally briefly reviewed patient's old medical records in [facility]   HPI: Jacqueline Sims is a 28 y.o. female with medical history significant of asthma anxiety, and depression; who presented with complaints of leg spasms starting last night.  Patient reports that she has had lower back pain with intermittent radiation down her legs for couple weeks now.  Last night she took tramadol 50 mg in effort to try and get some sleep.  Around 9 PM she woke up and noted that her legs were jerking uncontrollably.  Try to get up and had difficulty walking.  She initially thought symptoms were secondary to leg muscle spasm, but when symptoms persisted after 2 hours she came to the hospital for further evaluation.  Since that time she now notes that her right hand is twitching as well.  Other associated symptoms include palpitations since April and rash of her upper thighs that does not itch.  Denies having any fever, chest pain, shortness of breath, nausea, vomiting, diarrhea, dysuria, or recent fall/trauma.  Patient reports that she has been on Effexor for treatment of her anxiety and depression.  She was in the process of being changed from Effexor to Prozac by her psychiatrist.  Starting to make progress she was supposed to be on 75 mg of Effexor along with Prozac 20 mg daily for 1 month, then taper Effexor to 37.5 mg for 2 weeks, and someone per her psychiatrist recommendations.  ED Course: Upon admission into the emergency department patient was noted to be afebrile, pulse elevated to 137, and all other vital signs maintained.  Labs revealed WBC 14.2, hemoglobin 10.7 with low MCV and MCH, potassium 3.4, calcium 9.1, and CK 129.  Urinalysis was negative for signs of infection.   COVID-19 screening negative.  Patient was given 1 L normal saline IV fluids, 10 mg of Valium orally, and 750 mg of Robaxin orally.  Neurology have been consulted and recommended further testing.  TRH called to admit.  Review of Systems  Constitutional: Negative for chills and fever.  HENT: Negative for congestion and sinus pain.   Eyes: Negative for photophobia and pain.  Respiratory: Negative for cough and sputum production.   Cardiovascular: Positive for palpitations. Negative for chest pain.  Gastrointestinal: Negative for abdominal pain, diarrhea, nausea and vomiting.  Genitourinary: Negative for dysuria and hematuria.  Musculoskeletal: Positive for back pain and myalgias. Negative for falls.  Skin: Positive for rash. Negative for itching.  Neurological: Positive for tremors. Negative for loss of consciousness.  Endo/Heme/Allergies: Positive for environmental allergies.  Psychiatric/Behavioral: Negative for substance abuse.    Past Medical History:  Diagnosis Date  . Arthritis   . GERD (gastroesophageal reflux disease)   . Hay fever     Past Surgical History:  Procedure Laterality Date  . FLAT FOOT CORRECTION Right 2015     reports that she has never smoked. She has never used smokeless tobacco. She reports current alcohol use. She reports that she does not use drugs.  Allergies  Allergen Reactions  . Lactose Nausea And Vomiting    Family History  Problem Relation Age of Onset  . Hypothyroidism Mother   . Multiple sclerosis Other   . Heart disease Maternal Grandmother   . Heart disease Maternal Grandfather  Prior to Admission medications   Medication Sig Start Date End Date Taking? Authorizing Provider  albuterol (PROVENTIL HFA;VENTOLIN HFA) 108 (90 Base) MCG/ACT inhaler Inhale 2 puffs into the lungs every 6 (six) hours as needed for wheezing or shortness of breath. 04/11/18  Yes Dutch Quint B, FNP  azelastine (ASTELIN) 0.1 % nasal spray Place 1 spray into both  nostrils daily. Patient taking differently: Place 1 spray into both nostrils daily as needed for allergies.  03/08/18  Yes Billie Ruddy, MD  FLUoxetine (PROZAC) 20 MG capsule Take 20 mg by mouth daily. 05/15/18  Yes [provider]  norgestimate-ethinyl estradiol (ORTHO-CYCLEN,SPRINTEC,PREVIFEM) 0.25-35 MG-MCG tablet Take 1 tablet by mouth daily.  04/13/17  Yes [provider]  venlafaxine XR (EFFEXOR-XR) 75 MG 24 hr capsule Take 75 mg by mouth daily. 04/01/18  Yes [provider]  benzonatate (TESSALON) 100 MG capsule Take 1 capsule (100 mg total) by mouth 2 (two) times daily as needed for cough. Patient not taking: Reported on 06/14/2018 04/11/18   Billie Ruddy, MD    Physical Exam:  Constitutional: NAD, calm, comfortable Vitals:   06/14/18 0412 06/14/18 0445 06/14/18 0600 06/14/18 0730  BP: (!) 146/87 (!) 107/59 104/72 127/75  Pulse: (!) 109 98 84 (!) 110  Resp: '18 16 16   ' Temp:      TempSrc:      SpO2: 98% 98% 96% 100%  Weight:      Height:       Eyes: PERRL, lids and conjunctivae normal ENMT: Mucous membranes are moist. Normal dentition.  Neck: normal, supple, no masses, no thyromegaly Respiratory: clear to auscultation bilaterally, no wheezing, no crackles. Normal respiratory effort. No accessory muscle use.  Cardiovascular: Tachycardic, no murmurs / rubs / gallops. No extremity edema. 2+ pedal pulses. No carotid bruits.  Abdomen: no tenderness, no masses palpated. No hepatosplenomegaly. Bowel sounds positive.  Musculoskeletal: no clubbing / cyanosis. No joint deformity upper and lower extremities. Good ROM, no contractures.  Mildly increased muscle tone. Skin: Lacelike mildly erythematous rash of bilateral thigh present. Neurologic: CN 2-12 grossly intact. Reflexes 2+. Strength 5/5 in all 4.  Positive clonus noted in lower extremities. Psychiatric: Normal judgment and insight. Alert and oriented x 3. Normal mood.     Labs on Admission: I have  personally reviewed following labs and imaging studies  CBC: Recent Labs  Lab 06/13/18 2359  WBC 14.2*  NEUTROABS 8.3*  HGB 10.7*  HCT 34.1*  MCV 77.5*  PLT 768*   Basic Metabolic Panel: Recent Labs  Lab 06/13/18 2359  NA 138  K 3.4*  CL 106  CO2 22  GLUCOSE 111*  BUN 10  CREATININE 0.82  CALCIUM 9.1   GFR: Estimated Creatinine Clearance: 103.6 mL/min (by C-G formula based on SCr of 0.82 mg/dL). Liver Function Tests: Recent Labs  Lab 06/13/18 2359  AST 17  ALT 17  ALKPHOS 94  BILITOT 0.3  PROT 7.2  ALBUMIN 3.3*   No results for input(s): LIPASE, AMYLASE in the last 168 hours. No results for input(s): AMMONIA in the last 168 hours. Coagulation Profile: No results for input(s): INR, PROTIME in the last 168 hours. Cardiac Enzymes: Recent Labs  Lab 06/14/18 0411  CKTOTAL 129   BNP (last 3 results) No results for input(s): PROBNP in the last 8760 hours. HbA1C: No results for input(s): HGBA1C in the last 72 hours. CBG: No results for input(s): GLUCAP in the last 168 hours. Lipid Profile: No results for input(s): CHOL, HDL,  LDLCALC, TRIG, CHOLHDL, LDLDIRECT in the last 72 hours. Thyroid Function Tests: No results for input(s): TSH, T4TOTAL, FREET4, T3FREE, THYROIDAB in the last 72 hours. Anemia Panel: No results for input(s): VITAMINB12, FOLATE, FERRITIN, TIBC, IRON, RETICCTPCT in the last 72 hours. Urine analysis:    Component Value Date/Time   COLORURINE YELLOW 06/13/2018 2349   APPEARANCEUR CLEAR 06/13/2018 2349   LABSPEC 1.011 06/13/2018 2349   PHURINE 5.0 06/13/2018 2349   GLUCOSEU NEGATIVE 06/13/2018 2349   HGBUR NEGATIVE 06/13/2018 2349   BILIRUBINUR NEGATIVE 06/13/2018 2349   KETONESUR NEGATIVE 06/13/2018 2349   PROTEINUR NEGATIVE 06/13/2018 2349   NITRITE NEGATIVE 06/13/2018 2349   LEUKOCYTESUR NEGATIVE 06/13/2018 2349   Sepsis Labs: No results found for this or any previous visit (from the past 240 hour(s)).   Radiological Exams on  Admission: No results found.  EKG: Independently reviewed.  Sinus tachycardia 108 bpm  Assessment/Plan Hyperreflexia: Acute.  Patient presents with complaints of complaints of lower extremity leg jerking and now upper extremity tremor after taking tramadol last night.  Also noted complaints of lower back pain for some weeks.  Physical exam reveals positive clonus of the lower extremities. Neurology consulted and recommending work-up as seen below.  Family history significant of paternal grand aunt with MS and hypothyroidism.  Differential includes serotonin syndrome secondary to medications, multiple sclerosis, lesion of the lumbar spine, or other. -Admit to a telemetry bed -Follow-up C-reactive protein, ESR, thyroid studies, vitamin B12, magnesium, copper, and phosphorus levels -Follow-up MRI brain, cervical spine, and thoracic -Check EKG  -Physical therapy to evaluate and treat   -Normal saline IV fluids at 75 mL/h -Ativan IV as needed agitation, anxiety, or sedation -Appreciate neurology consultative services, will follow-up for further recommendation  Leukocytosis: WBC elevated at 14.2.  Urinalysis negative for signs of infection.  Suspect reactive to above. -Continue to monitor  Rash: Acute.  On the bilateral thighs that does not itch.  Question livedo reticularis. -Continue to monitor  Microcytic hypochromic anemia: Chronic.  Hemoglobin 10.7 g/dL, and patient reports baseline hemoglobin 11 to 12 g/dL.  Low MCV and MCH suggest iron deficiency. -Would benefit likely from taking multivitamin with iron  Anxiety and depression: Patient recently being weaned off of Effexor by her psychiatrist, but is also been taking Prozac.  Combination of these medications with tramadol could have brought about serotonin syndrome. -Hold Prozac and Effexor    Hypokalemia: Acute.  On admission potassium noted to be mildly low 3.4. -Give 20 mEq of potassium chloride x1 dose now -Will follow-up magnesium  level and replace if needed -Continue to monitor  Mild asthma: Patient rarely requires need of inhaler. -Albuterol as needed for shortness of breath/wheeze  Thrombocytosis: Acute.  Platelet count elevated at 465. -Continue to monitor  DVT prophylaxis: Lovenox Code Status: Full Family Communication: No family present at bedside.  Patient reports that she is already updated her mother. Disposition Plan: Likely discharge home if work-up negative Consults called: Neurology Admission status: observation  Norval Morton MD [facility] Pager [phone]   If 7PM-7AM, please contact night-coverage [email or web address] Password [facility]  06/14/2018, 8:46 AM

## 2018-06-14 NOTE — ED Notes (Signed)
Pt used wheelchair to use bathroom, pt able to stand and pivot successfully, however, still unsteady on feet over moderate to longer distances.

## 2018-06-14 NOTE — Consult Note (Addendum)
NEURO HOSPITALIST CONSULT NOTE   Requestig physician: Dr. Johnney Killian  Reason for Consult: Hyperreflexia  History obtained from:   Patient and Chart    HPI:                                                                                                                                          Jacqueline Sims is an 28 y.o. female presenting with acute onset of BLE spams and twitching with movement. She has difficulty ambulating due to this. The spasms are absent at rest. The symptoms began at 5:30 PM on Thursday evening. She took a 50 mg Ultram for pain prior to onset of the symptoms. She has taken Ultram before, with last dose about 3 years ago. Recently has been tapering off Effexor while escalating dose of Prozac, the latter now at 20 mg per day - the switch was begun about 1 month ago. The patient denies taking extra pills. Also denies EtOH use or sudden cessation, as well as no drug abuse. Of note, two of her mother's aunts had multiple sclerosis. She has been having LBP for several weeks, radiating to bilateral hips, which has been attributed to endometriosis by her Gynecologist.   Past Medical History:  Diagnosis Date  . Arthritis   . GERD (gastroesophageal reflux disease)   . Hay fever     Past Surgical History:  Procedure Laterality Date  . FLAT FOOT CORRECTION Right 2015    No family history on file.            Social History:  reports that she has never smoked. She has never used smokeless tobacco. She reports current alcohol use. She reports that she does not use drugs.  Allergies  Allergen Reactions  . Lactose Nausea And Vomiting    MEDICATIONS:                                                                                                                     Prior to Admission:  Medications Prior to Admission  Medication Sig Dispense Refill Last Dose  . albuterol (PROVENTIL HFA;VENTOLIN HFA) 108 (90 Base) MCG/ACT inhaler Inhale 2 puffs into the  lungs every 6 (six) hours as needed for wheezing or shortness of breath. 1 Inhaler 0 Past Month at  Unknown time  . azelastine (ASTELIN) 0.1 % nasal spray Place 1 spray into both nostrils daily. (Patient taking differently: Place 1 spray into both nostrils daily as needed for allergies. ) 30 mL 4 Past Month at Unknown time  . FLUoxetine (PROZAC) 20 MG capsule Take 20 mg by mouth daily.   06/13/2018 at Unknown time  . norgestimate-ethinyl estradiol (ORTHO-CYCLEN,SPRINTEC,PREVIFEM) 0.25-35 MG-MCG tablet Take 1 tablet by mouth daily.    06/13/2018 at Unknown time  . venlafaxine XR (EFFEXOR-XR) 75 MG 24 hr capsule Take 75 mg by mouth daily.   06/13/2018 at Unknown time  . benzonatate (TESSALON) 100 MG capsule Take 1 capsule (100 mg total) by mouth 2 (two) times daily as needed for cough. (Patient not taking: Reported on 06/14/2018) 20 capsule 0 Completed Course at Unknown time     ROS:                                                                                                                                       She denies pain or swelling to her limbs. No fever, SOB, GI symptoms, or joint symptoms other than LBP. No skin changes. Denies limb weakness or numbness. Other ROS as per HPI with all other systems negative    Blood pressure 127/75, pulse (!) 110, temperature 98.9 F (37.2 C), temperature source Oral, resp. rate 16, height 5' 4" (1.626 m), weight 77.1 kg, last menstrual period 05/21/2018, SpO2 100 %.   General Examination:                                                                                                       Physical Exam  HEENT-  [place]/AT  Lungs- Respirations unlabored Extremities- Warm and well perfused with no edema   Neurological Examination Mental Status:  Alert, oriented, thought content appropriate.  Speech fluent without evidence of aphasia.  Able to follow all commands without difficulty. Cranial Nerves: II: Visual fields intact with no extinction to DSS. Pupils  somewhat dilated bilaterally 6 mm >> 4 mm, equal and round.  III,IV, VI: No ptosis. EOMI with no nystagmus V,VII: No facial droop. Facial temp sensation equal bilaterally VIII: hearing intact to voice IX,X: No hypophonia XI: Symmetric XII: midline tongue extension Motor: Right : Upper extremity   5/5    Left:     Upper extremity   5/5  Lower extremity   5/5     Lower extremity   5/5 When asked to lift lower extremities antigravity,  there is prominent shaking movement that has an equivocal appearance regarding physiologic versus non-physiologic. Similar finding when holding RUE antigravity. There is some giveway weakness in this context with maximum strength elicitable at 5/5 in all 4 extremities. When limbs are at rest, there is no movement abnormality. Tone is normal with limbs passively flexed and extended by examiner after patient is asked to relax, but with intermittent resistance due to tremulousness prior to patient being asked to relax.  Sensory: Temp and light touch intact x 4 with no extinction or asymmetry Deep Tendon Reflexes:  3+ right brachioradialis and biceps 3+ left brachioradialis and biceps 4+ patellae bilaterally with 2-3 beats of clonus 2+ ankle jerks bilaterally Passive dorsiflexion of feet results in sustained clonus bilaterally  Positive Hoffman's sign bilaterally  Plantars: Bilaterally downgoing.  Cerebellar: No ataxia with FNF bilaterally  Gait: Deferred due to falls risk concerns in the setting of prominent tremor-like movements   Lab Results: Basic Metabolic Panel: Recent Labs  Lab 06/13/18 2359  NA 138  K 3.4*  CL 106  CO2 22  GLUCOSE 111*  BUN 10  CREATININE 0.82  CALCIUM 9.1    CBC: Recent Labs  Lab 06/13/18 2359  WBC 14.2*  NEUTROABS 8.3*  HGB 10.7*  HCT 34.1*  MCV 77.5*  PLT 465*    Cardiac Enzymes: Recent Labs  Lab 06/14/18 0411  CKTOTAL 129    Lipid Panel: No results for input(s): CHOL, TRIG, HDL, CHOLHDL, VLDL, LDLCALC in  the last 168 hours.  Imaging: No results found.  Assessment: 27 year old female with acute onset of BLE muscle spasms/tremor as well as spasm/tremor involving her RUE. The symptoms are present with movement and subside with rest.  1. Exam with findings that suggest both physiological and non-physiological findings. There is hyperreflexia with positive Hoffman's sign bilaterally, suggestive of a cervical spinal cord, brainstem or bilateral cerebral cortex lesion. However, the tremulousness is atypical. MS is a differential diagnostic consideration given her family history with presenting symptoms and signs, although the tremulousness seen on exam would be atypical.  2. Given dilated pupils and first time dose of Ultram in 3 years together with two antidepressants being taken by patient (Prozac and Effexor), serotonin syndrome is on the DDx.    Recommendations: 1. Hold Prozac, Effexor and Ultram 2. MRI of brain, cervical and thoracic spine with and without contrast.  3. TSH, B12, ESR, C-reactive protein, electrolytes, copper level.   Electronically signed: Dr. Kerney Elbe 06/14/2018, 8:26 AM

## 2018-06-14 NOTE — ED Provider Notes (Addendum)
[facility] EMERGENCY DEPARTMENT Provider Note   CSN: [id number] Arrival date & time: 06/13/18  2336    History   Chief Complaint Chief Complaint  Patient presents with  . Spasms    HPI Jacqueline Sims is a 28 y.o. female.     Patient to ED with c/o bilateral LE jerking movements with use, absent with rest, that started around 9:00 pm. No history of the same. No pain, swelling or color change. She has been having pain in her lower back for several weeks that has started radiating to bilateral hips that has been attributed to endometriosis evaluation by her GYN. She took a tramadol around 5:30 but has taken them in the past without side effect.   The history is provided by the patient. No language interpreter was used.    Past Medical History:  Diagnosis Date  . Arthritis   . GERD (gastroesophageal reflux disease)   . Hay fever     Patient Active Problem List   Diagnosis Date Noted  . Depression, recurrent ([place]) 01/27/2018  . Arthritis 01/27/2018  . Gastroesophageal reflux disease without esophagitis 01/27/2018  . Flat feet, bilateral 01/27/2018    Past Surgical History:  Procedure Laterality Date  . FLAT FOOT CORRECTION Right 2015     OB History   No obstetric history on file.      Home Medications    Prior to Admission medications   Medication Sig Start Date End Date Taking? Authorizing Provider  albuterol (PROVENTIL HFA;VENTOLIN HFA) 108 (90 Base) MCG/ACT inhaler Inhale 2 puffs into the lungs every 6 (six) hours as needed for wheezing or shortness of breath. 04/11/18  Yes Dutch Quint B, FNP  azelastine (ASTELIN) 0.1 % nasal spray Place 1 spray into both nostrils daily. Patient taking differently: Place 1 spray into both nostrils daily as needed for allergies.  03/08/18  Yes Billie Ruddy, MD  FLUoxetine (PROZAC) 20 MG capsule Take 20 mg by mouth daily. 05/15/18  Yes [provider]  norgestimate-ethinyl estradiol  (ORTHO-CYCLEN,SPRINTEC,PREVIFEM) 0.25-35 MG-MCG tablet Take 1 tablet by mouth daily.  04/13/17  Yes [provider]  venlafaxine XR (EFFEXOR-XR) 75 MG 24 hr capsule Take 75 mg by mouth daily. 04/01/18  Yes [provider]  benzonatate (TESSALON) 100 MG capsule Take 1 capsule (100 mg total) by mouth 2 (two) times daily as needed for cough. Patient not taking: Reported on 06/14/2018 04/11/18   Billie Ruddy, MD    Family History No family history on file.  Social History Social History   Tobacco Use  . Smoking status: Never Smoker  . Smokeless tobacco: Never Used  Substance Use Topics  . Alcohol use: Yes    Comment: occassionally  . Drug use: Never     Allergies   Lactose   Review of Systems Review of Systems  Constitutional: Negative for fever.  Respiratory: Negative.  Negative for shortness of breath.   Gastrointestinal: Negative.   Musculoskeletal: Negative for joint swelling.       See HPI.  Skin: Negative.  Negative for color change.  Neurological: Negative.  Negative for numbness.     Physical Exam Updated Vital Signs BP (!) 146/87 (BP Location: Right Arm)   Pulse (!) 109   Temp 98.9 F (37.2 C) (Oral)   Resp 18   Ht 5\' 4"  (1.626 m)   Wt 77.1 kg   LMP 05/21/2018 (Approximate)   SpO2 98%   BMI 29.18 kg/m   Physical Exam  Constitutional:      Appearance: She is well-developed.  Neck:     Musculoskeletal: Normal range of motion.  Cardiovascular:     Rate and Rhythm: Tachycardia present.  Pulmonary:     Effort: Pulmonary effort is normal.  Musculoskeletal:     Comments: No LE swelling. There is a spastic/jerking movement of bilateral LE's that appear involuntary when she engages the muscles the lower extremities. Pulses intact.   Skin:    General: Skin is warm and dry.  Neurological:     Mental Status: She is alert and oriented to person, place, and time.     Sensory: No sensory deficit.      ED Treatments / Results  Labs (all  labs ordered are listed, but only abnormal results are displayed) Labs Reviewed  COMPREHENSIVE METABOLIC PANEL - Abnormal; Notable for the following components:      Result Value   Potassium 3.4 (*)    Glucose, Bld 111 (*)    Albumin 3.3 (*)    All other components within normal limits  CBC WITH DIFFERENTIAL/PLATELET - Abnormal; Notable for the following components:   WBC 14.2 (*)    Hemoglobin 10.7 (*)    HCT 34.1 (*)    MCV 77.5 (*)    MCH 24.3 (*)    Platelets 465 (*)    Neutro Abs 8.3 (*)    Lymphs Abs 4.8 (*)    All other components within normal limits  URINALYSIS, ROUTINE W REFLEX MICROSCOPIC  CK  I-STAT BETA HCG BLOOD, ED ([facility], [facility], [facility] ONLY)    EKG None  Radiology No results found.  Procedures Procedures (including critical care time)  Medications Ordered in ED Medications  diazepam (VALIUM) tablet 10 mg (has no administration in time range)     Initial Impression / Assessment and Plan / ED Course  I have reviewed the triage vital signs and the nursing notes.  Pertinent labs & imaging results that were available during my care of the patient were reviewed by me and considered in my medical decision making (see chart for details).        Patient to ED with bilateral LE involuntary jerking that started this evening.   Labs are essentially WNL. Mild leukocytosis of 14.2.   Valium provided to help with symptoms. Will observe and re-evaluate. CK pending. She is tachycardic on arrival. Will give IVF's.   Tachycardia resolved. On re-exam, she is resting. No LE spastic motion while at rest. Attempted ambulation. She can bear weight, uses minimal assist, legs shaking.   DDx: muscular spasm vs restless leg vs psychogenic. Valium given without improvement. CK normal.   Discussed discharge home. Patient is concerned about ambulating at home, walking her dogs, risk of fall/injury. Will try Robaxin to see if there is any improvement and reassess.   Feel emergent  conditions have been ruled out and there is no life threatening condition present. She will be referred to Neurology for further outpatient evaluation.   ADDENDUM: The patient voiced concern about discharge while having persistent symptoms that she feels are worsening. She feels she is requiring assistance with ambulation. She lives in a townhouse and is concerned she will not be able to climb stairs safely. She has a large dog and does not feel she will be able to care for him. She is tearful.   Dr. Johnney Killian is in to evaluate her. She will call neurology to evaluate the patient. Patient care signed out to Hoover Browns, MD, pending  further evaluation.   Final Clinical Impressions(s) / ED Diagnoses   Final diagnoses:  None   1. Muscle fasciculations  ED Discharge Orders    None       Charlann Lange, PA-C 06/14/18 [id number]    Ward, Delice Bison, DO 06/14/18 0729    Charlann Lange, PA-C 06/14/18 0759    Ward, Delice Bison, DO 06/14/18 [phone]

## 2018-06-14 NOTE — ED Notes (Signed)
Pt taken to bathroom via wheelchair, appears shaky when ambulating.

## 2018-06-15 DIAGNOSIS — R270 Ataxia, unspecified: Secondary | ICD-10-CM | POA: Diagnosis not present

## 2018-06-15 DIAGNOSIS — R292 Abnormal reflex: Secondary | ICD-10-CM | POA: Diagnosis not present

## 2018-06-15 DIAGNOSIS — D72829 Elevated white blood cell count, unspecified: Secondary | ICD-10-CM | POA: Diagnosis not present

## 2018-06-15 DIAGNOSIS — E876 Hypokalemia: Secondary | ICD-10-CM | POA: Diagnosis not present

## 2018-06-15 DIAGNOSIS — G988 Other disorders of nervous system: Secondary | ICD-10-CM

## 2018-06-15 DIAGNOSIS — D509 Iron deficiency anemia, unspecified: Secondary | ICD-10-CM | POA: Diagnosis not present

## 2018-06-15 LAB — CBC
HCT: 32.2 % — ABNORMAL LOW (ref 36.0–46.0)
Hemoglobin: 10.2 g/dL — ABNORMAL LOW (ref 12.0–15.0)
MCH: 24.8 pg — ABNORMAL LOW (ref 26.0–34.0)
MCHC: 31.7 g/dL (ref 30.0–36.0)
MCV: 78.2 fL — ABNORMAL LOW (ref 80.0–100.0)
Platelets: 396 10*3/uL (ref 150–400)
RBC: 4.12 MIL/uL (ref 3.87–5.11)
RDW: 15.1 % (ref 11.5–15.5)
WBC: 7.5 10*3/uL (ref 4.0–10.5)
nRBC: 0 % (ref 0.0–0.2)

## 2018-06-15 LAB — BASIC METABOLIC PANEL
Anion gap: 9 (ref 5–15)
BUN: 6 mg/dL (ref 6–20)
CO2: 22 mmol/L (ref 22–32)
Calcium: 8.4 mg/dL — ABNORMAL LOW (ref 8.9–10.3)
Chloride: 106 mmol/L (ref 98–111)
Creatinine, Ser: 0.7 mg/dL (ref 0.44–1.00)
GFR calc Af Amer: >60 mL/min (ref >60)
GFR calc non Af Amer: >60 mL/min (ref >60)
Glucose, Bld: 96 mg/dL (ref 70–99)
Potassium: 3.9 mmol/L (ref 3.5–5.1)
Sodium: 137 mmol/L (ref 135–145)

## 2018-06-15 LAB — HIV ANTIBODY (ROUTINE TESTING W REFLEX): HIV Screen 4th Generation wRfx: NONREACTIVE

## 2018-06-15 LAB — T3, FREE: T3, Free: 4.5 pg/mL — ABNORMAL HIGH (ref 2.0–4.4)

## 2018-06-15 MED ORDER — CYANOCOBALAMIN 1000 MCG/ML IJ SOLN
1000.0000 ug | Freq: Once | INTRAMUSCULAR | Status: AC
  Administered 2018-06-15: 1000 ug via INTRAMUSCULAR
  Filled 2018-06-15: qty 1

## 2018-06-15 MED ORDER — FLUOXETINE HCL 20 MG PO CAPS: 20.0000 mg | ORAL_CAPSULE | Freq: Every day | ORAL | 3 refills | Status: DC

## 2018-06-15 MED ORDER — ROPINIROLE HCL 0.25 MG PO TABS: 0.2500 mg | ORAL_TABLET | Freq: Every day | ORAL | 0 refills | Status: DC

## 2018-06-15 NOTE — Discharge Summary (Signed)
Physician Discharge Summary  Jacqueline Sims JKK:[id number] DOB: 08-20-1990 DOA: 06/13/2018  PCP: Billie Ruddy, MD  Admit date: 06/13/2018 Discharge date: 06/15/2018  Time spent: 45 minutes  Recommendations for Outpatient Follow-up:  Patient will be discharged to home - outpatient occupational therapy.  Patient will need to follow up with primary care provider within one week of discharge.  Follow up with neurology and psychiatry. Patient should continue medications as prescribed.  Patient should follow a regular diet. Continue wean medications as recommended by your psychiatrist.   Discharge Diagnoses:  Principal Problem:   Hyperreflexia of lower extremity Active Problems:   Mild asthma   Hypokalemia   Microcytic hypochromic anemia   Thrombocytosis (HCC)   Rash   Leukocytosis   Discharge Condition: Stable  Diet recommendation: regular  Filed Weights   06/13/18 2340  Weight: 77.1 kg    History of present illness:  On 06/14/2018 by Dr. Fuller Plan Jacqueline Sims is a 28 y.o. female with medical history significant of asthma anxiety, and depression; who presented with complaints of leg spasms starting last night.  Patient reports that she has had lower back pain with intermittent radiation down her legs for couple weeks now.  Last night she took tramadol 50 mg in effort to try and get some sleep.  Around 9 PM she woke up and noted that her legs were jerking uncontrollably.  Try to get up and had difficulty walking.  She initially thought symptoms were secondary to leg muscle spasm, but when symptoms persisted after 2 hours she came to the hospital for further evaluation.  Since that time she now notes that her right hand is twitching as well.  Other associated symptoms include palpitations since April and rash of her upper thighs that does not itch.  Denies having any fever, chest pain, shortness of breath, nausea, vomiting, diarrhea, dysuria, or recent fall/trauma.   Patient reports that she has been on Effexor for treatment of her anxiety and depression.  She was in the process of being changed from Effexor to Prozac by her psychiatrist.  Starting to make progress she was supposed to be on 75 mg of Effexor along with Prozac 20 mg daily for 1 month, then taper Effexor to 37.5 mg for 2 weeks, and someone per her psychiatrist recommendations.  Hospital Course:  Hyperreflexia with jerking  -Presented with complaints of lower extremity leg and upper extremity jerking -Upon examination and entering the room, patient was no longer jerking her right upper extremity although when I introduced myself, she started to shake her arm arm uncontrollably.  Stated that the shaking/jerking happens intentionally with movement.  -MRI brain normal examination -MRI cervical and thoracic spine normal.  No cord lesion.  No structural spinal lesion.  No abnormal enhancement. -Free T4 0.92 -Vitamin B12 285 -PT recommended OT consult- OT recommended outpatient therapy -Neurology consulted and appreciated, recommended continuing Prozac 20mg  after tremor and hyperreflexia resolve. Stop Effexor. Consider Ropinirole 0.25mg  QHS.   Leukocytosis -Resolved. -COVID negative -UA unremarkable for infection -No upper respiratory symptoms -Currently afebrile  Rash -Need outpatient follow-up.  Currently not itchy  Microcytic anemia, chronic -Stable, hemoglobin 10.2 -Recommendb multivitamin with iron  Anxiety and depression -States that 1 psychiatrist is trying to wean her off of Effexor or -She is also taking Prozac -Patient to follow-up with psychiatry on discharge  Hypokalemia -Resolved with repletion -Repeat BMP in 1 week  Asthma -Stable, no wheezing noted on exam -Continue inhaler as needed  Thrombocytosis -Resolved, platelets  currently 396  Procedures: None  Consultations: Neurology   Discharge Exam: Vitals:   06/15/18 0403 06/15/18 1158  BP: 101/63 138/83    Pulse: 83 92  Resp: 17 16  Temp: (!) 97.5 F (36.4 C) 98 F (36.7 C)  SpO2: 100% 100%     General: Well developed, well nourished, NAD, appears stated age  HEENT: NCAT, PERRLA, EOMI, Anicteic Sclera, mucous membranes moist.  Neck: Supple  Cardiovascular: S1 S2 auscultated, no rubs, murmurs or gallops. Regular rate and rhythm.  Respiratory: Clear to auscultation bilaterally with equal chest rise  Abdomen: Soft, nontender, nondistended, + bowel sounds  Extremities: warm dry without cyanosis clubbing or edema  Neuro: AAOx3, intentional shaking of RUE, otherwise nonfocal. Strength intact  Skin: Without rashes exudates or nodules  Psych: Appropriate mood and affect  Discharge Instructions Discharge Instructions    Ambulatory referral to Neurology   Complete by:  As directed    An appointment is requested in approximately: 2 weeks   Ambulatory referral to Occupational Therapy   Complete by:  As directed    Discharge instructions   Complete by:  As directed    Patient will be discharged to home - outpatient occupational therapy.  Patient will need to follow up with primary care provider within one week of discharge.  Follow up with neurology and psychiatry. Patient should continue medications as prescribed.  Patient should follow a regular diet. Continue wean medications as recommended by your psychiatrist.     Allergies as of 06/15/2018      Reactions   Lactose Nausea And Vomiting      Medication List    STOP taking these medications   venlafaxine XR 75 MG 24 hr capsule Commonly known as:  EFFEXOR-XR     TAKE these medications   albuterol 108 (90 Base) MCG/ACT inhaler Commonly known as:  VENTOLIN HFA Inhale 2 puffs into the lungs every 6 (six) hours as needed for wheezing or shortness of breath.   azelastine 0.1 % nasal spray Commonly known as:  ASTELIN Place 1 spray into both nostrils daily. What changed:    when to take this  reasons to take this    benzonatate 100 MG capsule Commonly known as:  TESSALON Take 1 capsule (100 mg total) by mouth 2 (two) times daily as needed for cough.   FLUoxetine 20 MG capsule Commonly known as:  PROZAC Take 1 capsule (20 mg total) by mouth daily. Resume when tremors and hyperreflexia have resolved. What changed:  additional instructions   norgestimate-ethinyl estradiol 0.25-35 MG-MCG tablet Commonly known as:  ORTHO-CYCLEN Take 1 tablet by mouth daily.            Durable Medical Equipment  (From admission, onward)         Start     Ordered   06/15/18 1350  For home use only DME Walker rolling  [facility])  Once    Question:  Patient needs a walker to treat with the following condition  Answer:  Tremor   06/15/18 1349         Allergies  Allergen Reactions   Lactose Nausea And Vomiting   Follow-up Information    Billie Ruddy, MD. Schedule an appointment as soon as possible for a visit in 1 week(s).   Specialty:  Family Medicine Why:  Hospital follow up Contact information: [place] [address] [phone]        [facility]. Schedule an appointment as soon as  possible for a visit in 2 week(s).   Specialty:  Neurology Why:  Hospital follow up Contact information: [address] [place] [phone]           The results of significant diagnostics from this hospitalization (including imaging, microbiology, ancillary and laboratory) are listed below for reference.    Significant Diagnostic Studies: Mr Jeri Cos Wo Contrast  Result Date: 06/14/2018 CLINICAL DATA:  Acute presentation with spasms and generalized pain. EXAM: MRI HEAD WITHOUT AND WITH CONTRAST TECHNIQUE: Multiplanar, multiecho pulse sequences of the brain and surrounding structures were obtained without and with intravenous contrast. CONTRAST:  7 cc Gadavist COMPARISON:  None. FINDINGS: Brain: The brain has a normal  appearance without evidence of malformation, atrophy, old or acute small or large vessel infarction, mass lesion, hemorrhage, hydrocephalus or extra-axial collection. No evidence of demyelinating disease. No abnormal enhancement. Vascular: Major vessels at the base of the brain show flow. Venous sinuses appear patent. Skull and upper cervical spine: Normal. Sinuses/Orbits: Clear/normal. Other: None significant. IMPRESSION: Normal examination. No abnormality seen to explain the presenting symptoms. Electronically Signed   By: Nelson Chimes M.D.   On: 06/14/2018 16:53   Mr Cervical Spine W Wo Contrast  Result Date: 06/14/2018 CLINICAL DATA:  Acute presentation with spasms and back pain. EXAM: MRI CERVICAL AND thoracic SPINE WITHOUT AND WITH CONTRAST TECHNIQUE: Multiplanar and multiecho pulse sequences of the cervical spine, to include the craniocervical junction and cervicothoracic junction, and thoracic spine, were obtained without and with intravenous contrast. CONTRAST:  7 cc Gadavist COMPARISON:  None. FINDINGS: MRI CERVICAL SPINE FINDINGS Alignment: Normal Vertebrae: Normal Cord: Normal Posterior Fossa, vertebral arteries, paraspinal tissues: Normal Disc levels: Normal. No degenerative disc disease. No facet arthropathy. No canal or foraminal stenosis. No abnormal contrast enhancement. MRI thoracic SPINE FINDINGS Alignment:  Normal Vertebrae:  Normal Spinal cord: Normal. Paraspinal and other soft tissues: Normal Disc levels: Normal. No degenerative disc disease. No facet arthropathy. No abnormal enhancement. IMPRESSION: Normal MRI studies of the cervical and thoracic spine. No cord lesion. No structural spinal lesion. No abnormal enhancement. Electronically Signed   By: Nelson Chimes M.D.   On: 06/14/2018 16:49   Mr Thoracic Spine W Wo Contrast  Result Date: 06/14/2018 CLINICAL DATA:  Acute presentation with spasms and back pain. EXAM: MRI CERVICAL AND thoracic SPINE WITHOUT AND WITH CONTRAST TECHNIQUE:  Multiplanar and multiecho pulse sequences of the cervical spine, to include the craniocervical junction and cervicothoracic junction, and thoracic spine, were obtained without and with intravenous contrast. CONTRAST:  7 cc Gadavist COMPARISON:  None. FINDINGS: MRI CERVICAL SPINE FINDINGS Alignment: Normal Vertebrae: Normal Cord: Normal Posterior Fossa, vertebral arteries, paraspinal tissues: Normal Disc levels: Normal. No degenerative disc disease. No facet arthropathy. No canal or foraminal stenosis. No abnormal contrast enhancement. MRI thoracic SPINE FINDINGS Alignment:  Normal Vertebrae:  Normal Spinal cord: Normal. Paraspinal and other soft tissues: Normal Disc levels: Normal. No degenerative disc disease. No facet arthropathy. No abnormal enhancement. IMPRESSION: Normal MRI studies of the cervical and thoracic spine. No cord lesion. No structural spinal lesion. No abnormal enhancement. Electronically Signed   By: Nelson Chimes M.D.   On: 06/14/2018 16:49    Microbiology: Recent Results (from the past 240 hour(s))  SARS Coronavirus 2 (CEPHEID - Performed in [place] hospital lab), Hosp Order     Status: None   Collection Time: 06/14/18  9:16 AM  Result Value Ref Range Status   SARS Coronavirus 2 NEGATIVE NEGATIVE Final  Comment: (NOTE) If result is NEGATIVE SARS-CoV-2 target nucleic acids are NOT DETECTED. The SARS-CoV-2 RNA is generally detectable in upper and lower  respiratory specimens during the acute phase of infection. The lowest  concentration of SARS-CoV-2 viral copies this assay can detect is 250  copies / mL. A negative result does not preclude SARS-CoV-2 infection  and should not be used as the sole basis for treatment or other  patient management decisions.  A negative result may occur with  improper specimen collection / handling, submission of specimen other  than nasopharyngeal swab, presence of viral mutation(s) within the  areas targeted by this assay, and inadequate  number of viral copies  (<250 copies / mL). A negative result must be combined with clinical  observations, patient history, and epidemiological information. If result is POSITIVE SARS-CoV-2 target nucleic acids are DETECTED. The SARS-CoV-2 RNA is generally detectable in upper and lower  respiratory specimens dur ing the acute phase of infection.  Positive  results are indicative of active infection with SARS-CoV-2.  Clinical  correlation with patient history and other diagnostic information is  necessary to determine patient infection status.  Positive results do  not rule out bacterial infection or co-infection with other viruses. If result is PRESUMPTIVE POSTIVE SARS-CoV-2 nucleic acids MAY BE PRESENT.   A presumptive positive result was obtained on the submitted specimen  and confirmed on repeat testing.  While 2019 novel coronavirus  (SARS-CoV-2) nucleic acids may be present in the submitted sample  additional confirmatory testing may be necessary for epidemiological  and / or clinical management purposes  to differentiate between  SARS-CoV-2 and other Sarbecovirus currently known to infect humans.  If clinically indicated additional testing with an alternate test  methodology [phone]) is advised. The SARS-CoV-2 RNA is generally  detectable in upper and lower respiratory sp ecimens during the acute  phase of infection. The expected result is Negative. Fact Sheet for Patients:  StrictlyIdeas.no Fact Sheet for Healthcare Providers: BankingDealers.co.za This test is not yet approved or cleared by the [place] FDA and has been authorized for detection and/or diagnosis of SARS-CoV-2 by FDA under an Emergency Use Authorization (EUA).  This EUA will remain in effect (meaning this test can be used) for the duration of the COVID-19 declaration under Section 564(b)(1) of the Act, 21 U.S.C. section 360bbb-3(b)(1), unless the  authorization is terminated or revoked sooner. Performed at [facility], [place] [address] [place], [place] [address]      Labs: Basic Metabolic Panel: Recent Labs  Lab 06/13/18 2359 06/14/18 0657 06/15/18 0357  NA 138  --  137  K 3.4*  --  3.9  CL 106  --  106  CO2 22  --  22  GLUCOSE 111*  --  96  BUN 10  --  6  CREATININE 0.82  --  0.70  CALCIUM 9.1  --  8.4*  MG  --  2.1  --   PHOS  --  3.9  --    Liver Function Tests: Recent Labs  Lab 06/13/18 2359  AST 17  ALT 17  ALKPHOS 94  BILITOT 0.3  PROT 7.2  ALBUMIN 3.3*   No results for input(s): LIPASE, AMYLASE in the last 168 hours. No results for input(s): AMMONIA in the last 168 hours. CBC: Recent Labs  Lab 06/13/18 2359 06/15/18 0357  WBC 14.2* 7.5  NEUTROABS 8.3*  --   HGB 10.7* 10.2*  HCT 34.1* 32.2*  MCV 77.5* 78.2*  PLT  465* 396   Cardiac Enzymes: Recent Labs  Lab 06/14/18 0411  CKTOTAL 129   BNP: BNP (last 3 results) No results for input(s): BNP in the last 8760 hours.  ProBNP (last 3 results) No results for input(s): PROBNP in the last 8760 hours.  CBG: No results for input(s): GLUCAP in the last 168 hours.     Signed:  Cristal Ford  [facility] 06/15/2018, 1:51 PM

## 2018-06-15 NOTE — Progress Notes (Addendum)
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 She has significant anxiety/depression and has been altering her medication regimen over the last month per psychiatry. She tells me she was to decrease her Effexor 75 mg this week while taking Prozac 20mg . Both have been full strength for the last month. On evening of 5/28 she took 50mg  of Ultram for pain/sleep. She has been having bilateral lower leg pain and lower back pain for several weeks and she though this would help. It did not. She awoke with BLE spasms/course tremor, noted the RUE as well. She was afebrile, but did have elevated pulse and BP in ER.  Interval History: MRI Brain, C-spine and T-spine are all wnl, no evidence of demyelination nor acute findings. Her exam is highly variable and distractible. Only RUE noted to have abnormal movements today. None seen in BLE on my exam. I spoke with the pts mom by phone and updated her   ROS: Denies limb weakness or numbness. Other ROS as per HPI with all other systems negative   Past Medical History Past Medical History:  Diagnosis Date  . Arthritis   . Asthma   . GERD (gastroesophageal reflux disease)   . Hay fever     Past Surgical History Past Surgical History:  Procedure Laterality Date  . FLAT FOOT CORRECTION Right 2015    Allergies Allergies  Allergen Reactions  . Lactose Nausea And Vomiting    Home Medications Medications Prior to Admission  Medication Sig Dispense Refill  . albuterol (PROVENTIL HFA;VENTOLIN HFA) 108 (90 Base) MCG/ACT inhaler Inhale 2 puffs into the lungs every 6 (six) hours as needed for wheezing or shortness of breath. 1 Inhaler 0  . azelastine (ASTELIN) 0.1 % nasal spray Place 1 spray into both nostrils daily. (Patient taking differently: Place 1 spray into both nostrils daily as needed for allergies. ) 30 mL 4  . FLUoxetine (PROZAC) 20 MG capsule Take 20 mg by mouth daily.    . norgestimate-ethinyl estradiol  (ORTHO-CYCLEN,SPRINTEC,PREVIFEM) 0.25-35 MG-MCG tablet Take 1 tablet by mouth daily.     Marland Kitchen venlafaxine XR (EFFEXOR-XR) 75 MG 24 hr capsule Take 75 mg by mouth daily.    . benzonatate (TESSALON) 100 MG capsule Take 1 capsule (100 mg total) by mouth 2 (two) times daily as needed for cough. (Patient not taking: Reported on 06/14/2018) 20 capsule 0    Hospital Medications . cyanocobalamin  1,000 mcg Intramuscular Once  . enoxaparin (LOVENOX) injection  40 mg Subcutaneous Q24H  . norgestimate-ethinyl estradiol  1 tablet Oral Daily  . sodium chloride flush  3 mL Intravenous Q12H     Objective  Intake/Output from previous day: 05/29 0701 - 05/30 0700 In: 1173.8 [P.O.:1070; I.V.:103.8] Out: 625 [Urine:625] Intake/Output this shift: No intake/output data recorded. Nutritional status:  Diet Order            Diet regular Room service appropriate? Yes; Fluid consistency: Thin  Diet effective now               Physical Exam -  Vitals:   06/14/18 1720 06/14/18 2009 06/14/18 2330 06/15/18 0403  BP: 106/67 115/80 109/73 101/63  Pulse: 90 95 89 83  Resp: (!) 22 18 17 17   Temp: 98.3 F (36.8 C) 98.2 F (36.8 C) 98 F (36.7 C) (!) 97.5 F (36.4 C)  TempSrc: Oral Oral Oral Oral  SpO2: 99% 100% 100% 100%  Weight:      Height:  General/psych - Anxious young lady; no acute distress. Phones her mom during our exam  Heart - Regular rate and rhythm - no murmur Lungs - Clear to auscultation Abdomen - Soft - non tender Extremities - Distal pulses intact - no edema Skin - Warm and dry  Neurologic Exam:  Mental Status:  Alert, oriented, thought content appropriate. Speech without evidence of dysarthria or aphasia. Able to follow 3 step commands without difficulty.  Cranial Nerves:  II-bilateral visual fields intact III/IV/VI-Pupils were equal and reacted. No longer with pupillary dilatation. Extraocular movements were full.  V/VII-no facial numbness and no facial weakness.   VIII-hearing normal.  X-normal speech and symmetrical palatal movement.  XII-midline tongue extension  Motor: Right : Upper extremity   5/5    Left:     Upper extremity   5/5  Lower extremity   5/5     Lower extremity   5/5 Tone and bulk:normal tone throughout; no atrophy noted No LE jerking or tremors seen. She was holding a cup in right hand upon entering, no tremor or jercking noted, no spill or problem reaching out arm to set cup onto bedside table. When she puts cup down for my exam, the entire right arm begins jerking continuously and apparently uncontrollably in an up and down manner. She can hold it out in front and surprisingly control all movements to abduct/adduct during the jerks. When distracted, this stops completely. She then uses right arm to reach for phone and calls her mom w/o ataxia or decreased dexterity in right arm during this task during which abnormal movements stop.  Sensory: Intact to light touch in all extremities. Deep Tendon Reflexes: 4/4 throughout; no clonus seen on exam today Plantars: Downgoing bilaterally  Cerebellar: Normal finger to nose and heel to shin bilaterally. Gait: not tested    LABORATORY RESULTS:  Basic Metabolic Panel: Recent Labs  Lab 06/13/18 2359 06/14/18 0657 06/15/18 0357  NA 138  --  137  K 3.4*  --  3.9  CL 106  --  106  CO2 22  --  22  GLUCOSE 111*  --  96  BUN 10  --  6  CREATININE 0.82  --  0.70  CALCIUM 9.1  --  8.4*  MG  --  2.1  --   PHOS  --  3.9  --     Liver Function Tests: Recent Labs  Lab 06/13/18 2359  AST 17  ALT 17  ALKPHOS 94  BILITOT 0.3  PROT 7.2  ALBUMIN 3.3*   No results for input(s): LIPASE, AMYLASE in the last 168 hours. No results for input(s): AMMONIA in the last 168 hours.  CBC: Recent Labs  Lab 06/13/18 2359 06/15/18 0357  WBC 14.2* 7.5  NEUTROABS 8.3*  --   HGB 10.7* 10.2*  HCT 34.1* 32.2*  MCV 77.5* 78.2*  PLT 465* 396    Cardiac Enzymes: Recent Labs  Lab 06/14/18 0411   CKTOTAL 129    Lipid Panel: No results for input(s): CHOL, TRIG, HDL, CHOLHDL, VLDL, LDLCALC in the last 168 hours.  CBG: No results for input(s): GLUCAP in the last 168 hours.  Microbiology:   Coagulation Studies: No results for input(s): LABPROT, INR in the last 72 hours.  Miscellaneous Labs:   IMAGING RESULTS Mr Jeri Cos YW Contrast  Result Date: 06/14/2018 CLINICAL DATA:  Acute presentation with spasms and generalized pain. EXAM: MRI HEAD WITHOUT AND WITH CONTRAST TECHNIQUE: Multiplanar, multiecho pulse sequences of the brain and surrounding  structures were obtained without and with intravenous contrast. CONTRAST:  7 cc Gadavist COMPARISON:  None. FINDINGS: Brain: The brain has a normal appearance without evidence of malformation, atrophy, old or acute small or large vessel infarction, mass lesion, hemorrhage, hydrocephalus or extra-axial collection. No evidence of demyelinating disease. No abnormal enhancement. Vascular: Major vessels at the base of the brain show flow. Venous sinuses appear patent. Skull and upper cervical spine: Normal. Sinuses/Orbits: Clear/normal. Other: None significant. IMPRESSION: Normal examination. No abnormality seen to explain the presenting symptoms. Electronically Signed   By: Nelson Chimes M.D.   On: 06/14/2018 16:53   Mr Cervical Spine W Wo Contrast  Result Date: 06/14/2018 CLINICAL DATA:  Acute presentation with spasms and back pain. EXAM: MRI CERVICAL AND thoracic SPINE WITHOUT AND WITH CONTRAST TECHNIQUE: Multiplanar and multiecho pulse sequences of the cervical spine, to include the craniocervical junction and cervicothoracic junction, and thoracic spine, were obtained without and with intravenous contrast. CONTRAST:  7 cc Gadavist COMPARISON:  None. FINDINGS: MRI CERVICAL SPINE FINDINGS Alignment: Normal Vertebrae: Normal Cord: Normal Posterior Fossa, vertebral arteries, paraspinal tissues: Normal Disc levels: Normal. No degenerative disc disease. No  facet arthropathy. No canal or foraminal stenosis. No abnormal contrast enhancement. MRI thoracic SPINE FINDINGS Alignment:  Normal Vertebrae:  Normal Spinal cord: Normal. Paraspinal and other soft tissues: Normal Disc levels: Normal. No degenerative disc disease. No facet arthropathy. No abnormal enhancement. IMPRESSION: Normal MRI studies of the cervical and thoracic spine. No cord lesion. No structural spinal lesion. No abnormal enhancement. Electronically Signed   By: Nelson Chimes M.D.   On: 06/14/2018 16:49   Mr Thoracic Spine W Wo Contrast  Result Date: 06/14/2018 CLINICAL DATA:  Acute presentation with spasms and back pain. EXAM: MRI CERVICAL AND thoracic SPINE WITHOUT AND WITH CONTRAST TECHNIQUE: Multiplanar and multiecho pulse sequences of the cervical spine, to include the craniocervical junction and cervicothoracic junction, and thoracic spine, were obtained without and with intravenous contrast. CONTRAST:  7 cc Gadavist COMPARISON:  None. FINDINGS: MRI CERVICAL SPINE FINDINGS Alignment: Normal Vertebrae: Normal Cord: Normal Posterior Fossa, vertebral arteries, paraspinal tissues: Normal Disc levels: Normal. No degenerative disc disease. No facet arthropathy. No canal or foraminal stenosis. No abnormal contrast enhancement. MRI thoracic SPINE FINDINGS Alignment:  Normal Vertebrae:  Normal Spinal cord: Normal. Paraspinal and other soft tissues: Normal Disc levels: Normal. No degenerative disc disease. No facet arthropathy. No abnormal enhancement. IMPRESSION: Normal MRI studies of the cervical and thoracic spine. No cord lesion. No structural spinal lesion. No abnormal enhancement. Electronically Signed   By: Nelson Chimes M.D.   On: 06/14/2018 16:49    Impression: 28 year old female with acute onset of BLE muscle spasms/tremor as well as spasm/tremor involving her RUE. The symptoms are present with movement and subside with rest. Overall this has improved and now only evident in RUE. This is  distractible and highly variable during exam as well.  1. Atypical presentation of MS was a differential diagnostic consideration given her family history and her hyperreflexia. This has been ruled out with normal MRI of brain, cervical and thoracic spine.  2. Exam now with more non-physiological findings. While she has brisk reflexes, there is no hoffman's or clonus. The variable tremulousness seen on exam is atypical for any specific neurologic pattern. Given some improvement in her hyperreflexia after antidepressants and Ultram were held, serotonin syndrome is on the DDx. Would continue to hold antidepressants and Ultram.  3. Dilated pupils- now resolved. Ddx: medication effect of first time  dose of Ultram in 3 years together with two antidepressants being taken by patient (Prozac and Effexor), serotonin syndrome considered. Given no fever in the presence of the clonus, hyperreflexia and mydriasis, a mild/partial manifestation of serotonin syndrome is felt to be relatively high on the DDx.  4. BLE pain, specifically at night is current complaint- no BLE movements/tremors now seen. Ddx RLS and could trial ropinirole for this  Recommendations: -- D/c Ultram. The patient should no longer take this medication. Consulting Pharmacy for an alternate pain relieving medication is recommended.  -- From a Neurological standpoint, she can be restarted her on Prozac at 20 mg po qd after hyperreflexia and tremor completely resolve. Would stop Effexor. She should f/u with a psychologist to discuss possible life stressors and anxiety  -- Can consider 0.25mg  ropinirole QHS, but do not think this would be optimal. She seems to be at risk for development of polypharmacy -- Outpatient follow up with Neurology  -- Neurohospitalist service will sign off. Please call if there are additional questions.    Desiree Metzger-Cihelka, ARNP-C, ANVP-BC Pager: [phone]  Electronically signed: Dr. Kerney Elbe

## 2018-06-15 NOTE — Evaluation (Signed)
Occupational Therapy Evaluation Patient Details Name: Jacqueline Sims MRN: [id number] DOB: 1990-09-05 Today's Date: 06/15/2018    History of Present Illness Pt is a 28 y/o female admitted secondary to hyperreflexia of LEs. Workup pending and pt awaiting further imaging. PMH includes asthma, anxiety, and depression.    Clinical Impression   PTA patient independent and working. Admitted for above and limited by problem list below, including jerking like motion of B UEs/LES (R>L), impaired balance.  Patient currently able to complete transfers with min guard, UB ADLs with supervision, LB ADLs with min guard.  Patient tremulous in UEs but able to complete functional tasks with increased time.  Educated on safety and recommendations, pt reports her mother will be staying with her to assist for the next few days.  Recommend continued OT services while admitted and after dc at OP OT in order to maximize safety and independence with ADLs/ mobility.  Will follow.     Follow Up Recommendations  Outpatient OT;Supervision/Assistance - 24 hour    Equipment Recommendations  None recommended by OT    Recommendations for Other Services       Precautions / Restrictions Precautions Precautions: Fall Restrictions Weight Bearing Restrictions: No      Mobility Bed Mobility Overal bed mobility: Modified Independent                Transfers Overall transfer level: Needs assistance Equipment used: None(IV pole) Transfers: Sit to/from Stand Sit to Stand: Min guard         General transfer comment: min guard for safety, jerking movements with transition but calmed once standing     Balance Overall balance assessment: Needs assistance Sitting-balance support: No upper extremity supported;Feet supported Sitting balance-Leahy Scale: Fair     Standing balance support: No upper extremity supported;During functional activity Standing balance-Leahy Scale: Fair Standing balance comment:  able to ambulate without UE support given min guard, preference to UE support in standing                            ADL either performed or assessed with clinical judgement   ADL Overall ADL's : Needs assistance/impaired     Grooming: Supervision/safety;Standing Grooming Details (indicate cue type and reason): increased effort due to jerking motions of R UE  Upper Body Bathing: Sitting;Supervision/ safety   Lower Body Bathing: Min guard;Sit to/from stand   Upper Body Dressing : Supervision/safety;Sitting   Lower Body Dressing: Min guard;Sit to/from stand   Toilet Transfer: Min guard;Ambulation;Regular Toilet(IV pole)   Toileting- Clothing Manipulation and Hygiene: Sit to/from stand;Min guard       Functional mobility during ADLs: Min guard(IV pole ) General ADL Comments: discussed safety with mobility and ADLS, seated LB self care      Vision Baseline Vision/History: No visual deficits Vision Assessment?: No apparent visual deficits     Perception     Praxis      Pertinent Vitals/Pain Pain Assessment: No/denies pain     Hand Dominance Left   Extremity/Trunk Assessment Upper Extremity Assessment Upper Extremity Assessment: RUE deficits/detail;LUE deficits/detail RUE Deficits / Details: jerking type movements with movement, calms at rest; strength/sensation intact  RUE Coordination: decreased fine motor;decreased gross motor LUE Deficits / Details: intermittent jerking movements (but less than R UE), strength/sensation intact  LUE Coordination: decreased fine motor;decreased gross motor   Lower Extremity Assessment Lower Extremity Assessment: Defer to PT evaluation       Communication Communication Communication: No  difficulties   Cognition Arousal/Alertness: Awake/alert Behavior During Therapy: WFL for tasks assessed/performed Overall Cognitive Status: Within Functional Limits for tasks assessed                                      General Comments  reviewed recommendations and home safety     Exercises     Shoulder Instructions      Home Living Family/patient expects to be discharged to:: Private residence Living Arrangements: Alone Available Help at Discharge: Family;Available 24 hours/day Type of Home: Other(Comment)(townhouse) Home Access: Level entry     Home Layout: Two level Alternate Level Stairs-Number of Steps: flight Alternate Level Stairs-Rails: Right;Left Bathroom Shower/Tub: Teacher, early years/pre: Standard     Home Equipment: None   Additional Comments: Pt reports her mother is flying into town to stay with her for a couple of days      Prior Functioning/Environment Level of Independence: Independent        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[employer], driving          OT Problem List: Decreased activity tolerance;Impaired balance (sitting and/or standing);Decreased coordination;Impaired UE functional use      OT Treatment/Interventions: Self-care/ADL training;Energy conservation;Neuromuscular education;Therapeutic activities;Balance training;Patient/family education    OT Goals(Current goals can be found in the care plan section) Acute Rehab OT Goals Patient Stated Goal: to get back to normal OT Goal Formulation: With patient Time For Goal Achievement: 06/29/18 Potential to Achieve Goals: Good  OT Frequency: Min 2X/week   Barriers to D/C:            Co-evaluation              AM-PAC OT "6 Clicks" Daily Activity     Outcome Measure Help from another person eating meals?: None Help from another person taking care of personal grooming?: A Little Help from another person toileting, which includes using toliet, bedpan, or urinal?: A Little Help from another person bathing (including washing, rinsing, drying)?: A Little Help from another person to put on and taking off regular upper body clothing?: None Help from another person to put on and taking off regular  lower body clothing?: A Little 6 Click Score: 20   End of Session Equipment Utilized During Treatment: Gait belt Nurse Communication: Mobility status  Activity Tolerance: Patient tolerated treatment well Patient left: in bed;with call bell/phone within reach  OT Visit Diagnosis: Unsteadiness on feet (R26.81);Other abnormalities of gait and mobility (R26.89);Other symptoms and signs involving the nervous system (R29.898)                Time: 7425-9563 OT Time Calculation (min): 13 min Charges:  OT General Charges $OT Visit: 1 Visit OT Evaluation $OT Eval Moderate Complexity: 1 Bulger, OT Acute Rehabilitation Services Pager [phone] Office [phone]    Delight Stare 06/15/2018, 10:31 AM

## 2018-06-15 NOTE — Care Management (Signed)
Patient declined RW, has referral in place at Neuro rehab

## 2018-06-15 NOTE — Progress Notes (Signed)
Patient is discharging home. Mother has been called for transport. Discharge paperwork went over with patient. All questions and concerns addressed. All belongings sent with patient. Patient to be taken down in wheelchair. [place]

## 2018-06-17 ENCOUNTER — Other Ambulatory Visit: Payer: Self-pay

## 2018-06-17 ENCOUNTER — Encounter: Payer: Self-pay | Admitting: Family Medicine

## 2018-06-17 ENCOUNTER — Ambulatory Visit (INDEPENDENT_AMBULATORY_CARE_PROVIDER_SITE_OTHER): Payer: No Typology Code available for payment source | Admitting: Family Medicine

## 2018-06-17 DIAGNOSIS — D509 Iron deficiency anemia, unspecified: Secondary | ICD-10-CM

## 2018-06-17 DIAGNOSIS — Z09 Encounter for follow-up examination after completed treatment for conditions other than malignant neoplasm: Secondary | ICD-10-CM

## 2018-06-17 DIAGNOSIS — F329 Major depressive disorder, single episode, unspecified: Secondary | ICD-10-CM

## 2018-06-17 DIAGNOSIS — R292 Abnormal reflex: Secondary | ICD-10-CM | POA: Diagnosis not present

## 2018-06-17 DIAGNOSIS — F419 Anxiety disorder, unspecified: Secondary | ICD-10-CM | POA: Diagnosis not present

## 2018-06-17 DIAGNOSIS — F32A Depression, unspecified: Secondary | ICD-10-CM

## 2018-06-17 LAB — COPPER, SERUM: Copper: 275 ug/dL — ABNORMAL HIGH (ref 72–166)

## 2018-06-17 NOTE — Progress Notes (Signed)
Virtual Visit via Video Note  I connected with Jacqueline Sims on 06/17/18 at  3:00 PM EDT by a video enabled telemedicine application and verified that I am speaking with the correct person using two identifiers.  Location patient: home Location provider:work or home office Persons participating in the virtual visit: patient, provider  I discussed the limitations of evaluation and management by telemedicine and the availability of in person appointments. The patient expressed understanding and agreed to proceed.   HPI: Pt is a 28 yo female with pmh sig for asthma, anxiety, depression.  Pt seen for HFU, admitted 5/28-5/30 for hyperreflexia.  Pt states being worked up for [employer] by her OB/gyn, given tramadol for pain.  Pt took a tramadol for back pain, then started having jerking in legs.  In the ED, pt's R hand began jerking and she had difficulty walking.  Neuro was consulted.  MRI was negative.  Outpatient Neuro and OT recommended.  Microcytic anemia (hgb 10.2) noted on labs, MVI with iron recommended. Thrombocytosis and hypokalemia (repleted) initially noted, but resolved by time of d/c.   Prior to her ED visit, pt's Psychiatrist was switching her from effexor 75 mg to prozac 20 mg.  She started taking both meds and was suppose to taper the dose of Effexor.  Since d/c, pt states she is doing better, but still having jerking in her R hand when she holds it up.  Pt states she was advised to stop taking Effexor and Prozac, but is concerned about withdrawal.  She denies current withdrawal symptoms.  Pt also expresses frustration that she was advised her symptoms were likely "psychosomatic".  Pt denies feeling anxious or depressed.  States her mood and sleep have been good.  Pt requesting referral be placed by this provider to avoid denial by her insurance company.  Has an appt with OT schedule for Wednesday.  Pt states she has f/u with her new Psychiatrist in a few months as the appt was made  when the effexor taper was started.   ROS: See pertinent positives and negatives per HPI.  Past Medical History:  Diagnosis Date  . Arthritis   . Asthma   . GERD (gastroesophageal reflux disease)   . Hay fever     Past Surgical History:  Procedure Laterality Date  . FLAT FOOT CORRECTION Right 2015    Family History  Problem Relation Age of Onset  . Hypothyroidism Mother   . Multiple sclerosis Other   . Heart disease Maternal Grandmother   . Heart disease Maternal Grandfather     SOCIAL HX:    Current Outpatient Medications:  .  albuterol (PROVENTIL HFA;VENTOLIN HFA) 108 (90 Base) MCG/ACT inhaler, Inhale 2 puffs into the lungs every 6 (six) hours as needed for wheezing or shortness of breath., Disp: 1 Inhaler, Rfl: 0 .  azelastine (ASTELIN) 0.1 % nasal spray, Place 1 spray into both nostrils daily. (Patient taking differently: Place 1 spray into both nostrils daily as needed for allergies. ), Disp: 30 mL, Rfl: 4 .  benzonatate (TESSALON) 100 MG capsule, Take 1 capsule (100 mg total) by mouth 2 (two) times daily as needed for cough. (Patient not taking: Reported on 06/14/2018), Disp: 20 capsule, Rfl: 0 .  FLUoxetine (PROZAC) 20 MG capsule, Take 1 capsule (20 mg total) by mouth daily. Resume when tremors and hyperreflexia have resolved., Disp: , Rfl: 3 .  norgestimate-ethinyl estradiol (ORTHO-CYCLEN,SPRINTEC,PREVIFEM) 0.25-35 MG-MCG tablet, Take 1 tablet by mouth daily. , Disp: , Rfl:  .  rOPINIRole (REQUIP) 0.25 MG tablet, Take 1 tablet (0.25 mg total) by mouth at bedtime., Disp: 30 tablet, Rfl: 0  EXAM:  VITALS per patient if applicable:  RR between 12-20 bpm  GENERAL: alert, oriented, appears well and in no acute distress  HEENT: atraumatic, conjunctiva clear, no obvious abnormalities on inspection of external nose and ears  NECK: normal movements of the head and neck  LUNGS: on inspection no signs of respiratory distress, breathing rate appears normal, no obvious gross  SOB, gasping or wheezing  CV: no obvious cyanosis  MS: As pt hold R arm up to the camera the R hand begins to shake.  Moves all visible extremities without noticeable abnormality  PSYCH/NEURO: pleasant and cooperative, no obvious depression or anxiety, speech and thought processing grossly intact  ASSESSMENT AND PLAN:  Discussed the following assessment and plan:  Hyperreflexia  -referrals placed to OT and Neurology - Plan: Ambulatory referral to Occupational Therapy, CBC with Differential/Platelet, Basic metabolic panel, Iron and TIBC, Transferrin, Vitamin D, 25-hydroxy, Ambulatory referral to Neurology  Anxiety and depression -stable -pt advised to contact new Psychiatrist about restarting Prozac/Effexor taper.  Microcytic anemia  -OTC iron supplement recommended at d/c - Plan: CBC with Differential/Platelet, Iron and TIBC, Transferrin  Hospital f/u -hospital records reviewed -will order repeat labs as Hypocalcemia and microcytic anemia noted at time of d/c. -Cooper mildly elevated at 275.  CRP also elevated at 2.6. -pt given precautions   I discussed the assessment and treatment plan with the patient. The patient was provided an opportunity to ask questions and all were answered. The patient agreed with the plan and demonstrated an understanding of the instructions.   The patient was advised to call back or seek an in-person evaluation if the symptoms worsen or if the condition fails to improve as anticipated.   Billie Ruddy, MD

## 2018-06-18 ENCOUNTER — Encounter: Payer: Self-pay | Admitting: Family Medicine

## 2018-06-19 ENCOUNTER — Ambulatory Visit: Payer: No Typology Code available for payment source | Attending: Internal Medicine | Admitting: Occupational Therapy

## 2018-06-19 ENCOUNTER — Ambulatory Visit (INDEPENDENT_AMBULATORY_CARE_PROVIDER_SITE_OTHER): Payer: No Typology Code available for payment source | Admitting: Neurology

## 2018-06-19 ENCOUNTER — Other Ambulatory Visit: Payer: Self-pay

## 2018-06-19 ENCOUNTER — Telehealth: Payer: Self-pay | Admitting: Neurology

## 2018-06-19 ENCOUNTER — Encounter: Payer: Self-pay | Admitting: Neurology

## 2018-06-19 DIAGNOSIS — M5442 Lumbago with sciatica, left side: Secondary | ICD-10-CM | POA: Diagnosis not present

## 2018-06-19 DIAGNOSIS — R251 Tremor, unspecified: Secondary | ICD-10-CM

## 2018-06-19 DIAGNOSIS — R278 Other lack of coordination: Secondary | ICD-10-CM | POA: Diagnosis present

## 2018-06-19 DIAGNOSIS — R270 Ataxia, unspecified: Secondary | ICD-10-CM | POA: Insufficient documentation

## 2018-06-19 DIAGNOSIS — R29818 Other symptoms and signs involving the nervous system: Secondary | ICD-10-CM | POA: Insufficient documentation

## 2018-06-19 DIAGNOSIS — M5441 Lumbago with sciatica, right side: Secondary | ICD-10-CM | POA: Diagnosis not present

## 2018-06-19 DIAGNOSIS — M545 Low back pain, unspecified: Secondary | ICD-10-CM

## 2018-06-19 DIAGNOSIS — G8929 Other chronic pain: Secondary | ICD-10-CM

## 2018-06-19 HISTORY — DX: Other chronic pain: G89.29

## 2018-06-19 HISTORY — DX: Low back pain, unspecified: M54.50

## 2018-06-19 MED ORDER — GABAPENTIN 100 MG PO CAPS: 100.0000 mg | ORAL_CAPSULE | Freq: Three times a day (TID) | ORAL | 3 refills | Status: DC

## 2018-06-19 MED FILL — GABAPENTIN 100 MG CAPSULE: 100 | 30 days supply | Qty: 90 | Fill #0

## 2018-06-19 NOTE — Telephone Encounter (Signed)
°  Due to current COVID 19 pandemic, our office is severely reducing in office visits until further notice, in order to minimize the risk to our patients and healthcare providers.    Called patient and scheduled a virtual visit with Dr. Jannifer Franklin for 6/3. Patient verbalized understanding of the doxy.me process and I have sent an e-mail to [email or web address] with link and instructions as well as my name and our office number. Patient understands that they will receive a call from RN to update chart.   Pt understands that although there may be some limitations with this type of visit, we will take all precautions to reduce any security or privacy concerns.  Pt understands that this will be treated like an in office visit and we will file with pt's insurance, and there may be a patient responsible charge related to this service.

## 2018-06-19 NOTE — Patient Instructions (Signed)
Compensation Strategies for Tremors  When eating, try the following Eat out of bowls, divided plates, or use a plate guard (available at a medical supply store) and eat with a spoon so that you have an edge to scoop up food. Try raising your plate so that there is less distance between the plate and mouth.Try stabilizing elbows on the tables or against your body. Use utensil with built-up/larger grips as they are easier to hold.  When writing, try the following: Stabilize forearm on the table. Take your time as rushing/being stressed can increase tremors. Try a felt-tipped pen, it does not glide as much.  Avoid gel pens ( they move to much ). Consider using pre-printed labels with your name and address (carry them with you when you go out) or you can get stamps with your address or signature on it. Use a small tape recorder to record messages/reminders for yourself. Use pens with bigger grips.  When brushing your teeth, putting on make-up, or styling hair, try the following: Use an electric toothbrush. Use items with built-up grips. Stabilize your elbows against your body or on the counter. Use long-handled brushes/combs. Use a hair dryer with a stand.  In general: Avoid stress, fatigue or rushing as this can increase tremors. Sit down for activities that require more control/coordination. Perform "flicks".  

## 2018-06-19 NOTE — Therapy (Signed)
[facility] [address] [place], [place], [address] Phone: [phone]   Fax:  [phone]  Occupational Therapy Evaluation  Patient Details  Name: Jacqueline Sims MRN: [id number] Date of Birth: November 16, 1990 Referring Provider (OT): Dr. Grier Mitts   Encounter Date: 06/19/2018  OT End of Session - 06/19/18 1503    Visit Number  1    Number of Visits  6    Date for OT Re-Evaluation  07/26/18    Authorization Type  [facility] focus plan    OT Start Time  1304    OT Stop Time  1330    OT Time Calculation (min)  26 min    Activity Tolerance  Patient tolerated treatment well    Behavior During Therapy  [facility] for tasks assessed/performed       Past Medical History:  Diagnosis Date  . Arthritis   . Asthma   . Chronic low back pain 06/19/2018  . GERD (gastroesophageal reflux disease)   . Hay fever     Past Surgical History:  Procedure Laterality Date  . FLAT FOOT CORRECTION Right 2015    There were no vitals filed for this visit.  Subjective Assessment - 06/19/18 1307    Currently in Pain?  Yes    Pain Score  2     Pain Location  Foot    Pain Orientation  Right    Pain Descriptors / Indicators  Aching    Pain Type  Acute pain    Pain Onset  1 to 4 weeks ago    Pain Frequency  Intermittent    Aggravating Factors   movement    Pain Relieving Factors  rest, ibuprofen    Multiple Pain Sites  No        [facility] OT Assessment - 06/19/18 0001      Assessment   Medical Diagnosis  hyperreflexia   elevated copper levels   Referring Provider (OT)  Dr. Grier Mitts    Onset Date/Surgical Date  06/13/18    Hand Dominance  Left      Precautions   Precautions  Fall      Balance Screen   Has the patient fallen in the past 6 months  Yes    How many times?  1    Has the patient had a decrease in activity level because of a fear of falling?   No    Is the patient reluctant to leave their home because of a fear of  falling?   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      ADL   Eating/Feeding  Modified independent    Grooming  Modified independent    Upper Body Bathing  Modified independent    Lower Body Bathing  Modified independent    Lower Body Dressing  Modified independent    Toilet Transfer  Modified independent    Toileting - Clothing Manipulation  Modified independent    Tub/Shower Transfer  Modified independent    ADL comments  Pt reports difficulty with the following activities: brushing teeth with RUE, cutting food, fastening buttons, typing and using mouse for work      IADL  Shopping  Takes care of all shopping needs independently    Light Housekeeping  Performs light daily tasks such as dishwashing, bed making    Meal Prep  Able to complete simple warm meal prep    Medication Management  Is responsible for taking medication in correct dosages at correct time      Mobility   Mobility Status  Independent      Written Expression   Dominant Hand  Left    Written Experience  Within Functional Limits      Cognition   Overall Cognitive Status  Within Functional Limits for tasks assessed      Coordination   Gross Motor Movements are Fluid and Coordinated  No    9 Hole Peg Test  Right;Left    Right 9 Hole Peg Test  24.18    Left 9 Hole Peg Test  22.60    Box and Blocks  RUE 41, LUE 50    Tremors  tremor and ataxic movements present in right greater than left UE.    Coordination  impaired bilaterally R more than left UE      ROM / Strength   AROM / PROM / Strength  AROM;PROM      AROM   Overall AROM   Within functional limits for tasks performed      PROM   Overall PROM   Within functional limits for tasks performed      Hand Function   Right Hand Grip (lbs)  80     Left Hand Grip (lbs)  75                           OT Long Term Goals - 06/19/18 1522      OT LONG TERM GOAL #1   Title  I with HEP.    Time  5    Period  Weeks    Status  New      OT LONG TERM GOAL #2   Title  Pt will verbalize understanding of compensatory strategies for ataxic movements/ tremors.    Time  5    Period  Weeks    Status  New      OT LONG TERM GOAL #3   Title  Pt will report increased ease with the following: cutting food, fastening buttonsand brushing teeth.    Time  5    Period  Weeks    Status  New      OT LONG TERM GOAL #4   Title  Pt will report increased ease with computer use for work.    Time  5    Period  Weeks    Status  New            Plan - 06/19/18 1515    Clinical Impression Statement  28 yr old pt has significant anxiety/depression and has been altering her medication regimen over the last month per psychiatry. She awoke with BLE spasms/course tremor, noted the RUE as well. She was afebrile, but did have elevated pulse and BP in ER.MRI Brain, C-spine and T-spine are all wnl, no evidence of demyelination nor acute findings. Pt does have elevated copper levels. Pt was hospitalized 5/28-5/30/2020. Pt is scheduled to see neurology for futher evaluation. Pt presents with the following deficits: ataxia, and tremor in bilateral UE's right more impaired than left UE. Pt reports difficulties with gait as well and she plas to request PT referral from neurology.  Pt can benefit from skilled occupational therapy to maximize safety and independence with ADLs/ IADLs.    OT Occupational Profile and History  Problem Focused Assessment - Including review of records relating to presenting problem    Occupational performance deficits (Please refer to evaluation for details):  ADL's;IADL's;Play;Social Participation    Body Structure / Function / Physical Skills  ADL;Dexterity;Pain;UE functional  use;IADL;ROM;Flexibility;Mobility;Coordination;FMC;Endurance;GMC;Gait    Rehab Potential  Fair    Clinical Decision Making  Limited treatment options, no task modification necessary    Comorbidities Affecting Occupational Performance:  None    Modification or Assistance to Complete Evaluation   No modification of tasks or assist necessary to complete eval    OT Frequency  1x / week    OT Duration  6 weeks   including eval   OT Treatment/Interventions  Self-care/ADL training;Moist Heat;Fluidtherapy;DME and/or AE instruction;Balance training;Therapeutic Air traffic controller;Therapeutic exercise;Passive range of motion;Neuromuscular education;Functional Mobility Training;Electrical Stimulation;Paraffin;Energy conservation;Manual Therapy;Patient/family education    Plan  compensatory strategies, HEP for weightbearing    Consulted and Agree with Plan of Care  Patient       Patient will benefit from skilled therapeutic intervention in order to improve the following deficits and impairments:  Body Structure / Function / Physical Skills  Visit Diagnosis: Ataxia - Plan: Ot plan of care cert/re-cert  Other symptoms and signs involving the nervous system - Plan: Ot plan of care cert/re-cert  Tremor - Plan: Ot plan of care cert/re-cert  Other lack of coordination - Plan: Ot plan of care cert/re-cert    Problem List Patient Active Problem List   Diagnosis Date Noted  . Chronic low back pain 06/19/2018  . Hyperreflexia 06/15/2018  . Ataxia   . Neurologic disorder   . Hyperreflexia of lower extremity 06/14/2018  . Mild asthma 06/14/2018  . Hypokalemia 06/14/2018  . Microcytic hypochromic anemia 06/14/2018  . Thrombocytosis ([place]) 06/14/2018  . Rash 06/14/2018  . Leukocytosis 06/14/2018  . Depression, recurrent ([place]) 01/27/2018  . Arthritis 01/27/2018  . Gastroesophageal reflux disease without esophagitis 01/27/2018  . Flat feet, bilateral 01/27/2018    Delma Drone 06/19/2018, 4:11  PM Theone Murdoch, OTR/L [phone] Phone: [phone] 4:11 PM 06/19/18 [facility] [address] [place], [place], [address] Phone: [phone]   Fax:  [phone]  Name: Jacqueline Sims MRN: [id number] Date of Birth: Nov 05, 1990

## 2018-06-19 NOTE — Progress Notes (Signed)
Virtual Visit via Video Note  I connected with Jacqueline Sims on 06/19/18 at  3:30 PM EDT by a video enabled telemedicine application and verified that I am speaking with the correct person using two identifiers.  Location: Patient: The patient is at home. Provider: Physician in office.   I discussed the limitations of evaluation and management by telemedicine and the availability of in person appointments. The patient expressed understanding and agreed to proceed.  History of Present Illness: Jacqueline Sims is a 28 year old left-handed black female with a history of depression followed through psychiatry.  The patient had been transitioning off of Effexor and going to Prozac, she was admitted to the hospital on 13 Jun 2018.  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  The patient suddenly began having some tremors in both legs, right greater than left that was associated with some discomfort, she does have a history of chronic low back pain and pain down both legs dating back about 6 months.  She has been worked up for the possibility of endometriosis.  The tremors spread to involve both arms, right greater than left after she got to the hospital.  The patient underwent MRI of the brain, cervical spine, and thoracic spine that were completely normal.  She was seen by neurology, blood work was done revealing a slightly elevated TSH and copper level, with a slightly elevated C-reactive protein, and a sedimentation rate of 31.  The patient had a white blood count of 14.2 and a hemoglobin of 10.7, MCV of 77.5 platelets of 465.  The comprehensive metabolic profile showed a low albumin level of 3.3.  The patient was taken off of her Prozac and Effexor, she had also been taking Ultram if needed for her back pain.  Since returning home, the patient has been in occupational therapy, she continues to have her low back pain.  She has had improvement in her  tremors but still has some tremor mainly involving the right hand is present with activity of the arm, not at rest.  Her ability to ambulate is relatively normal.  She reports no difficulty controlling bowels with bladder.  She does have occasional headaches.  She was given a trial on Requip for possible restless leg syndrome but this offered no benefit and she stopped this.   Observations/Objective: The video evaluation reveals the patient is alert and cooperative.  The patient has a symmetric face, she is able to protrude the tongue in the midline with good lateral movement of the tongue.  Speech is well enunciated, not aphasic or dysarthric.  She has good full range of movement the cervical spine and lumbar spine.  She has fair finger-nose-finger but the tremors present with this activity on the right, she has normal and symmetric heel shin bilaterally.  Gait is normal, tandem gait normal.  Romberg is negative.  No drift is seen.  Extraocular movements are full.  Assessment and Plan: 1.  New onset tremor  2.  Depression  3.  Chronic low back pain and bilateral leg pain  The patient has had a sudden onset of tremor, no abnormalities were seen on MRI study of the brain, cervical spine, or thoracic spine.  The patient was on 3 medications that could result in serotonin syndrome, these medications have been stopped.  The patient will be set up for physical therapy for neuromuscular therapy of her low back.  She will be placed on  low-dose gabapentin.  If her back pain does not improve in the future we may consider MRI of the lumbar spine.  The right upper extremity tremor could be factitious in nature.  She will follow-up here in 2 months.  Follow Up Instructions: 23-month follow-up with me, need face-to-face visit.   I discussed the assessment and treatment plan with the patient. The patient was provided an opportunity to ask questions and all were answered. The patient agreed with the plan and  demonstrated an understanding of the instructions.   The patient was advised to call back or seek an in-person evaluation if the symptoms worsen or if the condition fails to improve as anticipated.  I provided 30 minutes of non-face-to-face time during this encounter.   Kathrynn Ducking, MD

## 2018-06-25 ENCOUNTER — Telehealth: Payer: Self-pay | Admitting: Neurology

## 2018-06-25 NOTE — Telephone Encounter (Signed)
Events noted.  Reference to race is important as it often changes the differential diagnosis for each patient.

## 2018-06-25 NOTE — Telephone Encounter (Signed)
I called patient yesterday to schedule her 2 month follow-up. Patient replied that she does not want to come back to see Dr. Jannifer Franklin. I enquired as to why this is, as the patient audibly seemed upset. Patient stated that she read the office notes that Dr. Jannifer Franklin logged for her virtual video visit in [place]. She stated that Dr. Jannifer Franklin should "ask a patient's race before assuming." She explains that in the office notes Dr. Jannifer Franklin documented that she is a "black, right-handed female" and is upset by this and she "does not want to be defined" by her race. I explained to the patient that nothing derogatory is meant by a provider adding demographics to a patient's office notes, and that all providers do this regardless of a patient's race for medical reasons to provide the best possible care. I explained that different races and ethnic history can put patients at different risks for some diseases and conditions. Patient then stated that to her, Dr. Jannifer Franklin made it sound like her problems were "all in her head" in the office notes. I advised patient that Dr. Jannifer Franklin, or any doctor in our office, always takes a patient's health concerns seriously and would never belittle a patient in any way. I apologized to patient that she feels this way and advised her to call back if needed. Patient agreed. We ended the call.

## 2018-06-27 ENCOUNTER — Other Ambulatory Visit: Payer: Self-pay | Admitting: *Deleted

## 2018-06-27 ENCOUNTER — Encounter: Payer: Self-pay | Admitting: *Deleted

## 2018-06-27 ENCOUNTER — Other Ambulatory Visit (INDEPENDENT_AMBULATORY_CARE_PROVIDER_SITE_OTHER): Payer: No Typology Code available for payment source

## 2018-06-27 ENCOUNTER — Other Ambulatory Visit: Payer: Self-pay

## 2018-06-27 ENCOUNTER — Ambulatory Visit (INDEPENDENT_AMBULATORY_CARE_PROVIDER_SITE_OTHER): Payer: No Typology Code available for payment source | Admitting: Family Medicine

## 2018-06-27 ENCOUNTER — Encounter: Payer: Self-pay | Admitting: Family Medicine

## 2018-06-27 DIAGNOSIS — M5441 Lumbago with sciatica, right side: Secondary | ICD-10-CM

## 2018-06-27 DIAGNOSIS — R292 Abnormal reflex: Secondary | ICD-10-CM | POA: Diagnosis not present

## 2018-06-27 DIAGNOSIS — R251 Tremor, unspecified: Secondary | ICD-10-CM

## 2018-06-27 DIAGNOSIS — R790 Abnormal level of blood mineral: Secondary | ICD-10-CM | POA: Diagnosis not present

## 2018-06-27 DIAGNOSIS — M5442 Lumbago with sciatica, left side: Secondary | ICD-10-CM

## 2018-06-27 DIAGNOSIS — D509 Iron deficiency anemia, unspecified: Secondary | ICD-10-CM

## 2018-06-27 NOTE — Progress Notes (Signed)
Virtual Visit via Video Note  I connected with Jacqueline Sims on 06/27/18 at  8:30 AM EDT by a video enabled telemedicine application and verified that I am speaking with the correct person using two identifiers.  Location patient: home Location provider:work or home office Persons participating in the virtual visit: patient, provider  I discussed the limitations of evaluation and management by telemedicine and the availability of in person appointments. The patient expressed understanding and agreed to proceed.   HPI: Pt with continued low back pain that radiates into her legs, L>R.   Happening x 1 month.  Heat helps.  Ibuprofen does not help.  Pt was afraid to take tramadol given recent tremors 2/2 serotonin syndrome.  Pt states the tremors from last week have stopped.  Pt was transitioning from effexor to prozac for anxiety and depression by [facility].  She was also taking Tramadol prn for possible endometriosis by OB/Gyn.  Pt saw Neuro outpt, but was unhappy after viewing the note from the visit.  Pt states there was mention her tremor was likely from serotonin syndrome, but could be factitious.  She also did not appreciate being called a "black female" as she was not asked about her race and her chart list her race as two or more races.  Pt identifies as bi-racial.    Since last OFV, pt states she noticed her copper level came back elevated.  She is interested in having a zinc level checked after doing some research.  Pt denies taking any supplements.  LFTs were normal.   ROS: See pertinent positives and negatives per HPI.  Past Medical History:  Diagnosis Date  . Arthritis   . Asthma   . Chronic low back pain 06/19/2018  . GERD (gastroesophageal reflux disease)   . Hay fever     Past Surgical History:  Procedure Laterality Date  . FLAT FOOT CORRECTION Right 2015    Family History  Problem Relation Age of Onset  . Hypothyroidism Mother   . Multiple sclerosis Other   .  Heart disease Maternal Grandmother   . Heart disease Maternal Grandfather     SOCIAL HX:    Current Outpatient Medications:  .  albuterol (PROVENTIL HFA;VENTOLIN HFA) 108 (90 Base) MCG/ACT inhaler, Inhale 2 puffs into the lungs every 6 (six) hours as needed for wheezing or shortness of breath., Disp: 1 Inhaler, Rfl: 0 .  azelastine (ASTELIN) 0.1 % nasal spray, Place 1 spray into both nostrils daily. (Patient taking differently: Place 1 spray into both nostrils daily as needed for allergies. ), Disp: 30 mL, Rfl: 4 .  benzonatate (TESSALON) 100 MG capsule, Take 1 capsule (100 mg total) by mouth 2 (two) times daily as needed for cough. (Patient not taking: Reported on 06/14/2018), Disp: 20 capsule, Rfl: 0 .  FLUoxetine (PROZAC) 20 MG capsule, Take 1 capsule (20 mg total) by mouth daily. Resume when tremors and hyperreflexia have resolved., Disp: , Rfl: 3 .  gabapentin (NEURONTIN) 100 MG capsule, Take 1 capsule (100 mg total) by mouth 3 (three) times daily., Disp: 90 capsule, Rfl: 3 .  norgestimate-ethinyl estradiol (ORTHO-CYCLEN,SPRINTEC,PREVIFEM) 0.25-35 MG-MCG tablet, Take 1 tablet by mouth daily. , Disp: , Rfl:   EXAM:  VITALS per patient if applicable:  RR between 12-20 bpm  GENERAL: alert, oriented, appears well and in no acute distress  HEENT: atraumatic, conjunctiva clear, no obvious abnormalities on inspection of external nose and ears  NECK: normal movements of the head and neck  LUNGS: on  inspection no signs of respiratory distress, breathing rate appears normal, no obvious gross SOB, gasping or wheezing  CV: no obvious cyanosis  MS: moves all visible extremities without noticeable abnormality  PSYCH/NEURO: pleasant and cooperative, no obvious depression or anxiety, speech and thought processing grossly intact  ASSESSMENT AND PLAN:  Discussed the following assessment and plan:  Acute bilateral low back pain with bilateral sciatica  -discussed stretching, massage, heat,  ice -discussed prednisone, pt declines at this time. - Plan: Ambulatory referral to Physical Therapy, CBC  Tremor  -resolving -likely 2/2 serotonin syndrome as pt was taking Effexor, Prozac, and Tramadol.  Continue f/u with Psychiatry -will recheck labs - Plan: Ceruloplasmin, Zinc, CBC, BMP  F/u prn   I discussed the assessment and treatment plan with the patient. The patient was provided an opportunity to ask questions and all were answered. The patient agreed with the plan and demonstrated an understanding of the instructions.   The patient was advised to call back or seek an in-person evaluation if the symptoms worsen or if the condition fails to improve as anticipated.   Billie Ruddy, MD

## 2018-06-27 NOTE — Patient Outreach (Signed)
[facility]) Care Management  06/27/2018  Charlesia Canaday 1990-04-15 [id number]  Transition of care call   Referral received:06/27/18 Initial outreach: 06/27/18 Insurance: [facility] [employer] Plan   Subjective: Initial successful telephone call to patient's preferred number in order to complete transition of care assessment; 2 HIPAA identifiers verified. Explained purpose of call and completed transition of care assessment.  Jacqueline Sims, a Electrical engineer for [employer], says she is doing well, has completed her follow up visits with her primary care provider and her initial appointment with the neurologist, and she attended her first outpatient occupational therapy session. She says she has had no more hyperreflexia since she started back on her antidepressant and she attributes the hyperreflexia to withdrawal from her selective serotonin reuptake inhibitors as she was in the process of changing antidepressants when the symptoms presented. She says she has an appointment with her psychiatrist on 07/10/18.  She says her Mom flew in to help her recover when she was discharged.  She denies any ongoing health issues and says she/he does not need a referral to one of the [place] chronic disease management programs.    Objective:  Doreen was hospitalized at [facility] from 5/28-5/30/2020 for ataxia and hyperreflexia of the extremities and hypokalemia. Comorbidities include: recurrent depression, arthritis, GERD, mild asthma, chronic low back pain, irritable bowel syndrome, and rhinitis She was discharged to home on 06/15/18 without the need for home health services or DME.   Assessment:  Patient voices good understanding of all discharge instructions.  See transition of care flowsheet for assessment details.   Plan:  Reviewed hospital discharge diagnosis of hyperreflexia and treatment plan using hospital discharge instructions, assessing medication  adherence,  reviewing problems requiring provider notification, and discussing the importance of follow up with primary care provider and specialists as directed. No ongoing care management needs identified so will close case to [place] [facility] care management services and route successful outreach letter with [facility] Management pamphlet and 24 Hour Nurse Line Magnet to [facility] clinical pool to be mailed to patient's home address.   Barrington Ellison RN,CCM,CDE [place] Management Coordinator Office Phone [phone] Office Fax [phone]

## 2018-06-28 LAB — CBC WITH DIFFERENTIAL/PLATELET
Basophils Absolute: 0.2 10*3/uL — ABNORMAL HIGH (ref 0.0–0.1)
Basophils Relative: 1.7 % (ref 0.0–3.0)
Eosinophils Absolute: 0.1 10*3/uL (ref 0.0–0.7)
Eosinophils Relative: 0.6 % (ref 0.0–5.0)
HCT: 35.8 % — ABNORMAL LOW (ref 36.0–46.0)
Hemoglobin: 11.4 g/dL — ABNORMAL LOW (ref 12.0–15.0)
Lymphocytes Relative: 24.3 % (ref 12.0–46.0)
Lymphs Abs: 3.2 10*3/uL (ref 0.7–4.0)
MCHC: 31.9 g/dL (ref 30.0–36.0)
MCV: 77.2 fl — ABNORMAL LOW (ref 78.0–100.0)
Monocytes Absolute: 0.5 10*3/uL (ref 0.1–1.0)
Monocytes Relative: 3.4 % (ref 3.0–12.0)
Neutro Abs: 9.3 10*3/uL — ABNORMAL HIGH (ref 1.4–7.7)
Neutrophils Relative %: 70 % (ref 43.0–77.0)
Platelets: 524 10*3/uL — ABNORMAL HIGH (ref 150.0–400.0)
RBC: 4.64 Mil/uL (ref 3.87–5.11)
RDW: 15.5 % (ref 11.5–15.5)
WBC: 13.2 10*3/uL — ABNORMAL HIGH (ref 4.0–10.5)

## 2018-06-28 LAB — BASIC METABOLIC PANEL
BUN: 13 mg/dL (ref 6–23)
CO2: 21 mEq/L (ref 19–32)
Calcium: 9 mg/dL (ref 8.4–10.5)
Chloride: 104 mEq/L (ref 96–112)
Creatinine, Ser: 0.8 mg/dL (ref 0.40–1.20)
GFR: 86.01 mL/min (ref >60.00)
Glucose, Bld: 70 mg/dL (ref 70–99)
Potassium: 5.3 mEq/L — ABNORMAL HIGH (ref 3.5–5.1)
Sodium: 137 mEq/L (ref 135–145)

## 2018-07-02 ENCOUNTER — Telehealth: Payer: Self-pay | Admitting: Family Medicine

## 2018-07-02 LAB — CERULOPLASMIN: Ceruloplasmin: >66 mg/dL — ABNORMAL HIGH (ref 18–53)

## 2018-07-02 LAB — ZINC: Zinc: 74 ug/dL (ref 60–130)

## 2018-07-03 ENCOUNTER — Ambulatory Visit: Payer: No Typology Code available for payment source | Admitting: Occupational Therapy

## 2018-07-03 NOTE — Telephone Encounter (Signed)
Copied from [place] [id number]. Topic: Appointment Scheduling - Scheduling Inquiry for Clinic >> Jul 02, 2018  4:10 PM Rayann Heman wrote: Reason for CRM: pt called and stated that she would like to schedule 24 hour urine copper level. Please see recent lab results

## 2018-07-04 ENCOUNTER — Other Ambulatory Visit: Payer: Self-pay | Admitting: Family Medicine

## 2018-07-04 ENCOUNTER — Ambulatory Visit: Payer: No Typology Code available for payment source | Attending: Family Medicine | Admitting: Physical Therapy

## 2018-07-04 ENCOUNTER — Encounter: Payer: Self-pay | Admitting: Physical Therapy

## 2018-07-04 ENCOUNTER — Other Ambulatory Visit: Payer: Self-pay

## 2018-07-04 DIAGNOSIS — R790 Abnormal level of blood mineral: Secondary | ICD-10-CM

## 2018-07-04 DIAGNOSIS — G8929 Other chronic pain: Secondary | ICD-10-CM | POA: Insufficient documentation

## 2018-07-04 DIAGNOSIS — M5442 Lumbago with sciatica, left side: Secondary | ICD-10-CM | POA: Insufficient documentation

## 2018-07-04 DIAGNOSIS — M5441 Lumbago with sciatica, right side: Secondary | ICD-10-CM | POA: Diagnosis present

## 2018-07-04 DIAGNOSIS — M6281 Muscle weakness (generalized): Secondary | ICD-10-CM | POA: Insufficient documentation

## 2018-07-04 MED FILL — GABAPENTIN 100 MG CAPSULE: 100 | 30 days supply | Qty: 90 | Fill #0

## 2018-07-04 NOTE — Therapy (Signed)
[facility] [address], [place], [place], [address] Phone: [phone]   Fax:  [phone]  Physical Therapy Evaluation  Patient Details  Name: Jacqueline Sims MRN: [id number] Date of Birth: Apr 11, 1990 Referring Provider (PT): Billie Ruddy, MD   Encounter Date: 07/04/2018  PT End of Session - 07/04/18 2105    Visit Number  1    Date for PT Re-Evaluation  08/29/18    Authorization Type  [facility]    PT Start Time  1755    PT Stop Time  1840    PT Time Calculation (min)  45 min    Activity Tolerance  Patient tolerated treatment well    Behavior During Therapy  [facility] for tasks assessed/performed       Past Medical History:  Diagnosis Date  . Arthritis   . Asthma   . Chronic low back pain 06/19/2018  . GERD (gastroesophageal reflux disease)   . Hay fever     Past Surgical History:  Procedure Laterality Date  . FLAT FOOT CORRECTION Right 2015    There were no vitals filed for this visit.   Subjective Assessment - 07/04/18 1753    Subjective  Pt has had years of LBP which has worsened over the past several months.  Pain now extends into bil LEs Lt>Rt along lateral thigh into posterior knee.  Pt notes that pain is worse during her period and MD suspects possible endometriosis.    Pertinent History  Recent hospitalization (May 2020) due to hyperreflexia/ataxia related to withdrawal symptoms/change of anti-depressant medication, mostly resolved. Rt flat foot correction surgery 2015    Limitations  Sitting;Other (comment);Lifting   laying in bed   How long can you sit comfortably?  1 hour    How long can you stand comfortably?  unsure    How long can you walk comfortably?  unsure    Diagnostic tests  no, will do MRI if PT doesn't help    Patient Stated Goals  be able to sit at work without pain, sleep on sides    Currently in Pain?  Yes    Pain Score  8     Pain Location  Back    Pain Orientation   Left;Lower;Lateral    Pain Descriptors / Indicators  Tightness;Sharp    Pain Type  Chronic pain    Pain Radiating Towards  Lt buttock and lateral thigh    Pain Onset  More than a month ago    Pain Frequency  Intermittent    Aggravating Factors   sitting, laying on sides, bending over    Pain Relieving Factors  heat    Effect of Pain on Daily Activities  sleep, work         [facility] PT Assessment - 07/04/18 0001      Assessment   Medical Diagnosis  M54.42,M54.41 (ICD-10-CM) - Acute bilateral low back pain with bilateral sciatica    Referring Provider (PT)  Billie Ruddy, MD    Onset Date/Surgical Date  --   approx 2 months ago   Hand Dominance  Left    Next MD Visit  no    Prior Therapy  no      Precautions   Precautions  None      Restrictions   Weight Bearing Restrictions  No      Balance Screen   Has the patient fallen in the past 6 months  Yes    How many  times?  1    Has the patient had a decrease in activity level because of a fear of falling?   No    Is the patient reluctant to leave their home because of a fear of falling?   No      Home Environment   Living Environment  Private residence    Living Arrangements  Alone    Type of [place] to enter    Entrance Stairs-Number of Steps  1    Home Layout  Two level      Prior Function   Level of Independence  Independent    Loss adjuster, chartered at [employer], sitting, sometimes in clinic      Cognition   Overall Cognitive Status  Within Functional Limits for tasks assessed      Observation/Other Assessments   Focus on Therapeutic Outcomes (FOTO)   44%   goal 31%     Sensation   Light Touch  Appears Intact      Posture/Postural Control   Posture/Postural Control  Postural limitations    Postural Limitations  Increased lumbar lordosis      ROM / Strength   AROM / PROM / Strength  AROM;PROM;Strength      AROM   Overall AROM   Within functional limits for tasks  performed    Overall AROM Comments  Pt with excessive bil hip ER, lateral shearing of upper lumbar spine with trunk SB bil, no pain all trunk and hip ROM with excpetion of lumbar extension which is full with end range pain central upper lumbar spine      PROM   Overall PROM Comments  excessive bil hip ER without pain      Strength   Overall Strength  Within functional limits for tasks performed    Overall Strength Comments  bil LEs      Flexibility   Soft Tissue Assessment /Muscle Length  yes   hip flexors limited by 20% bil with back pain on testing   Quadriceps  limited by 20% bil      Palpation   Spinal mobility  + anterior shear L2, L3, L4    SI assessment   + laxity on Lt, active SLR test made easier with posterior pelvic compression    Palpation comment  Lt>Rt tender: SI joint line, TFL, IT band, gluteals, hip flexors, lateral quads, adductors      Special Tests    Special Tests  Lumbar    Lumbar Tests  Straight Leg Raise      Straight Leg Raise   Findings  Positive    Side   Left    Comment  at 40 deg      Balance   Balance Assessed  Yes                Objective measurements completed on examination: See above findings.              PT Education - 07/04/18 1843    Education Details  Access Code: [email or web address]    Person(s) Educated  Patient    Methods  Explanation;Demonstration;Verbal cues;Handout    Comprehension  Verbalized understanding;Returned demonstration       PT Short Term Goals - 07/04/18 2112      PT SHORT TERM GOAL #1   Title  ind in initial HEP    Time  3    Period  Weeks  Status  New    Target Date  07/25/18      PT SHORT TERM GOAL #2   Title  Pt will demo proper seated posture for improved pain in sitting for driving and work tasks.    Time  3    Period  Weeks    Status  New    Target Date  07/25/18      PT SHORT TERM GOAL #3   Title  Pt will report overall reduction in pain by at least 20%    Time  4    Period   Weeks    Status  New    Target Date  08/01/18        PT Long Term Goals - 07/04/18 2114      PT LONG TERM GOAL #1   Title  Pt will be ind in advanced HEP and understand how to safely progress    Time  8    Period  Weeks    Status  New    Target Date  08/29/18      PT LONG TERM GOAL #2   Title  Pt will report overall reduction in pain by at least 70%    Time  8    Period  Weeks    Status  New    Target Date  08/29/18      PT LONG TERM GOAL #3   Title  Pt will report at least 50% improvement in sleep due to less pain    Time  8    Period  Weeks    Status  New    Target Date  08/29/18      PT LONG TERM GOAL #4   Title  Pt will be able to sit at work for at least 1 hour with pain rating not to exceed 3/10.    Time  8    Period  Weeks    Status  New    Target Date  08/29/18             Plan - 07/04/18 1853    Clinical Impression Statement  Pt presents with long history of LBP which worsened approx 2 months ago when pain started to radiate down bil LEs Lt>Rt along buttock, lateral thigh and posterior knee.  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  She notes pain is worse during her menstrual cycle and anticipates further investigation of suspected endometriosis in the near future.  She presents with laxity in Lt SI joint and lumbar spine as seen with SI testing and provocation shear testing.  Active SLR is made easier with posterior pelvic compression; bridging is made easier with black theraband clamshell overlay.  She has a + SLR on Lt LE at 40 deg.  Flexibility restrictions are present in hip flexors and quads bil.  She has full ROM in trunk and bil hips without pain with exception of lumbar extension at end range.  She will benefit from skilled PT for core, pelvic, and hip strength to assist stability of lumbar spine and pelvis, Pt education for symptom management including sitting posture at work and sleeping  strategies, manual therapy and modalities as needed for pain.    Personal Factors and Comorbidities  Comorbidity 1;Comorbidity 2    Comorbidities  long history of LBP, possible endometriosis, anxiety, depression    Examination-Activity Limitations  Lift;Bend;Sit;Sleep    Stability/Clinical Decision Making  Stable/Uncomplicated  Clinical Decision Making  Low    Rehab Potential  Excellent    PT Frequency  2x / week    PT Duration  8 weeks    PT Treatment/Interventions  ADLs/Self Care Home Management;Cryotherapy;Electrical Stimulation;Iontophoresis 4mg /ml Dexamethasone;Moist Heat;Traction;Therapeutic exercise;Neuromuscular re-education;Patient/family education;Manual techniques;Passive range of motion;Dry needling;Taping;Spinal Manipulations;Joint Manipulations    PT Next Visit Plan  f/u on HEP, instruct in hip flexor and quad stretches (add to HEP), progress lumbopelvic hip stability and core (try monster walks, sidestepping with band, squats with band around knees)    PT Home Exercise Plan  Access Code: [email or web address]    Consulted and Agree with Plan of Care  Patient       Patient will benefit from skilled therapeutic intervention in order to improve the following deficits and impairments:  Impaired tone, Hypermobility, Pain, Impaired flexibility, Improper body mechanics, Decreased strength, Postural dysfunction  Visit Diagnosis: 1. Chronic bilateral low back pain with bilateral sciatica   2. Muscle weakness (generalized)        Problem List Patient Active Problem List   Diagnosis Date Noted  . Chronic low back pain 06/19/2018  . Hyperreflexia 06/15/2018  . Ataxia   . Neurologic disorder   . Hyperreflexia of lower extremity 06/14/2018  . Mild asthma 06/14/2018  . Hypokalemia 06/14/2018  . Microcytic hypochromic anemia 06/14/2018  . Thrombocytosis ([place]) 06/14/2018  . Rash 06/14/2018  . Leukocytosis 06/14/2018  . Dysmenorrhea 06/03/2018  . Irritable bowel syndrome 06/03/2018  .  Depression, recurrent ([place]) 01/27/2018  . Arthritis 01/27/2018  . Gastroesophageal reflux disease without esophagitis 01/27/2018  . Flat feet, bilateral 01/27/2018  . Rhinitis, allergic 04/17/2006    Baruch Merl, PT 07/04/18 9:23 PM   [facility] [address], [place], [place], [address] Phone: [phone]   Fax:  [phone]  Name: Shayleigh Bouldin MRN: [id number] Date of Birth: 04/30/1990

## 2018-07-04 NOTE — Telephone Encounter (Unsigned)
Copied from [place] [id number]. Topic: Appointment Scheduling - Scheduling Inquiry for Clinic >> Jul 02, 2018  4:10 PM Rayann Heman wrote: Reason for CRM: pt called and stated that she would like to schedule 24 hour urine copper level. Please see recent lab results

## 2018-07-04 NOTE — Patient Instructions (Signed)
Access Code: [email or web address]  URL: [email or web address]  Date: 07/04/2018  Prepared by: Venetia Night Beuhring   Exercises  Supine Posterior Pelvic Tilt - 10 reps - 3 sets - 5 hold - 1x daily - 7x weekly  Supine Double Knee to Chest - 10 reps - 3 sets - 5 hold - 1x daily - 7x weekly  Hooklying Single Knee to Chest - 10 reps - 3 sets - 5 hold - 1x daily - 7x weekly  Bridge with Hip Abduction and Resistance - 10 reps - 3 sets - 1x daily - 7x weekly

## 2018-07-04 NOTE — Telephone Encounter (Signed)
This was advised per my last lab result note.  Pt needs to be given a container for collection and advised on storage.

## 2018-07-05 NOTE — Telephone Encounter (Signed)
Patient called the office and was informed per Nunzio Cory to walk in to the lab on Monday around 8:30am to pick up the container and instructions.  Patient agreed to do so and is aware to not come in if she has been exposed to COVID or sick at all.

## 2018-07-08 LAB — VITAMIN D 25 HYDROXY (VIT D DEFICIENCY, FRACTURES): VITD: 15.66 ng/mL — ABNORMAL LOW (ref 30.00–100.00)

## 2018-07-08 NOTE — Addendum Note (Signed)
Addended by: Gwynne Edinger on: 07/08/2018 09:37 AM   Modules accepted: Orders

## 2018-07-08 NOTE — Addendum Note (Signed)
Addended by: Gwynne Edinger on: 07/08/2018 09:31 AM   Modules accepted: Orders

## 2018-07-09 LAB — TRANSFERRIN: Transferrin: 397 mg/dL — ABNORMAL HIGH (ref 212.0–360.0)

## 2018-07-09 LAB — IRON, TOTAL/TOTAL IRON BINDING CAP
%SAT: 6 % (calc) — ABNORMAL LOW (ref 16–45)
Iron: 30 ug/dL — ABNORMAL LOW (ref 40–190)
TIBC: 497 mcg/dL (calc) — ABNORMAL HIGH (ref 250–450)

## 2018-07-09 NOTE — Addendum Note (Signed)
Addended by: Gwynne Edinger on: 07/09/2018 03:01 PM   Modules accepted: Orders

## 2018-07-11 ENCOUNTER — Other Ambulatory Visit: Payer: Self-pay

## 2018-07-11 ENCOUNTER — Encounter: Payer: Self-pay | Admitting: Physical Therapy

## 2018-07-11 ENCOUNTER — Ambulatory Visit: Payer: No Typology Code available for payment source | Admitting: Physical Therapy

## 2018-07-11 DIAGNOSIS — M5442 Lumbago with sciatica, left side: Secondary | ICD-10-CM | POA: Diagnosis not present

## 2018-07-11 DIAGNOSIS — M5441 Lumbago with sciatica, right side: Secondary | ICD-10-CM

## 2018-07-11 DIAGNOSIS — G8929 Other chronic pain: Secondary | ICD-10-CM

## 2018-07-11 DIAGNOSIS — M6281 Muscle weakness (generalized): Secondary | ICD-10-CM

## 2018-07-11 LAB — COPPER, URINE - RANDOM OR 24 HOUR
Copper / Creatinine Ratio: 9 ug/g creat (ref 0–49)
Copper, 24H Ur: 10 ug/24 hr (ref 3–35)
Copper, Ur: 20 ug/L
Creatinine(Crt),U: 2.35 g/L (ref 0.30–3.00)

## 2018-07-11 NOTE — Therapy (Signed)
[facility] [address], [place], [place], [address] Phone: [phone]   Fax:  [phone]  Physical Therapy Treatment  Patient Details  Name: Jacqueline Sims MRN: [id number] Date of Birth: 1990/08/14 Referring Provider (PT): Billie Ruddy, MD   Encounter Date: 07/11/2018  PT End of Session - 07/11/18 0755    Visit Number  2    Date for PT Re-Evaluation  08/29/18    Authorization Type  [facility]    PT Start Time  0755    PT Stop Time  0846    PT Time Calculation (min)  51 min    Activity Tolerance  Patient tolerated treatment well    Behavior During Therapy  [facility] for tasks assessed/performed       Past Medical History:  Diagnosis Date  . Arthritis   . Asthma   . Chronic low back pain 06/19/2018  . GERD (gastroesophageal reflux disease)   . Hay fever     Past Surgical History:  Procedure Laterality Date  . FLAT FOOT CORRECTION Right 2015    There were no vitals filed for this visit.  Subjective Assessment - 07/11/18 0757    Subjective  Pain is "in the middle" this AM. Intensity is averga/normal.    Pertinent History  Recent hospitalization (May 2020) due to hyperreflexia/ataxia related to withdrawal symptoms/change of anti-depressant medication, mostly resolved. Rt flat foot correction surgery 2015    Limitations  Sitting;Other (comment);Lifting    Currently in Pain?  Yes    Pain Score  4     Pain Location  Back    Pain Orientation  Lower    Pain Descriptors / Indicators  Aching    Multiple Pain Sites  No                       [facility] Adult PT Treatment/Exercise - 07/11/18 0001      Lumbar Exercises: Stretches   Double Knee to Chest Stretch  2 reps;30 seconds    Double Knee to Chest Stretch Limitations  VC on form    Hip Flexor Stretch  Right;Left;2 reps;30 seconds    Hip Flexor Stretch Limitations  TC to align LTLE properly     Pelvic Tilt  5 reps;5 seconds    Other Lumbar  Stretch Exercise  Soft foam roll gliding and shearing  gastroc, hamstrings, lumbar at end of session. 1 min each       Lumbar Exercises: Aerobic   Nustep  L2x 5 min      Lumbar Exercises: Supine   Ab Set  5 reps   6x   AB Set Limitations  Tried in neutral spine to challenge her lower abdominals more. This was difficult but not painful.     Bent Knee Raise  2 seconds   Bil 6 reps   Bent Knee Raise Limitations  LT TA weaker than RT, will do LTLE lifts for additional HEP    Other Supine Lumbar Exercises  isometric hip ER with black band 6 sec x 6       Lumbar Exercises: Sidelying   Other Sidelying Lumbar Exercises  Modified side plank, feet down on mat, 5 sec 2x LT, 10 sec 2x RT   VC to lift from bottom waist and engage core              PT Short Term Goals - 07/04/18 2112      PT SHORT TERM GOAL #  1   Title  ind in initial HEP    Time  3    Period  Weeks    Status  New    Target Date  07/25/18      PT SHORT TERM GOAL #2   Title  Pt will demo proper seated posture for improved pain in sitting for driving and work tasks.    Time  3    Period  Weeks    Status  New    Target Date  07/25/18      PT SHORT TERM GOAL #3   Title  Pt will report overall reduction in pain by at least 20%    Time  4    Period  Weeks    Status  New    Target Date  08/01/18        PT Long Term Goals - 07/04/18 2114      PT LONG TERM GOAL #1   Title  Pt will be ind in advanced HEP and understand how to safely progress    Time  8    Period  Weeks    Status  New    Target Date  08/29/18      PT LONG TERM GOAL #2   Title  Pt will report overall reduction in pain by at least 70%    Time  8    Period  Weeks    Status  New    Target Date  08/29/18      PT LONG TERM GOAL #3   Title  Pt will report at least 50% improvement in sleep due to less pain    Time  8    Period  Weeks    Status  New    Target Date  08/29/18      PT LONG TERM GOAL #4   Title  Pt will be able to sit at work  for at least 1 hour with pain rating not to exceed 3/10.    Time  8    Period  Weeks    Status  New    Target Date  08/29/18            Plan - 07/11/18 0755    Clinical Impression Statement  Pt presents with low back pain this morning, denies radicular pain. She is independent and compliant with her initial HEP. We added a hip flexor stretch LT when she does single knee to chest stretch on the RT. Pt exibits core weakness in supine. She could find neutral pelvis, although this was dificult at first, and contract her TA. This is not a stong contraction and LT demonstrates > weakness than RT. She was given supine TA work with marching to work on this. She was not limited by back pain in neutral spine/pelvis. Isometric hip ER was fatiguing demonstrating her decreased stability in hip ER. Pain was abolishe din the lumbar spine at the end of her session.    Personal Factors and Comorbidities  Comorbidity 1;Comorbidity 2    Comorbidities  long history of LBP, possible endometriosis, anxiety, depression    Examination-Activity Limitations  Lift;Bend;Sit;Sleep    Stability/Clinical Decision Making  Stable/Uncomplicated    Rehab Potential  Excellent    PT Frequency  2x / week    PT Duration  8 weeks    PT Treatment/Interventions  ADLs/Self Care Home Management;Cryotherapy;Electrical Stimulation;Iontophoresis 4mg /ml Dexamethasone;Moist Heat;Traction;Therapeutic exercise;Neuromuscular re-education;Patient/family education;Manual techniques;Passive range of motion;Dry needling;Taping;Spinal Manipulations;Joint Manipulations    PT Next  Visit Plan  Check TA contraction and seeif improved. If improved, progress her core strength exercises. She does have RT foot issues so standing exercises might be a deterent, consider keeping supine or even sidelying or quadruped when ready. Quad strech LT, might need for HEP.    PT Home Exercise Plan  Access Code: [email or web address]    Consulted and Agree with Plan of Care   Patient       Patient will benefit from skilled therapeutic intervention in order to improve the following deficits and impairments:  Impaired tone, Hypermobility, Pain, Impaired flexibility, Improper body mechanics, Decreased strength, Postural dysfunction  Visit Diagnosis: 1. Chronic bilateral low back pain with bilateral sciatica   2. Muscle weakness (generalized)        Problem List Patient Active Problem List   Diagnosis Date Noted  . Chronic low back pain 06/19/2018  . Hyperreflexia 06/15/2018  . Ataxia   . Neurologic disorder   . Hyperreflexia of lower extremity 06/14/2018  . Mild asthma 06/14/2018  . Hypokalemia 06/14/2018  . Microcytic hypochromic anemia 06/14/2018  . Thrombocytosis ([place]) 06/14/2018  . Rash 06/14/2018  . Leukocytosis 06/14/2018  . Dysmenorrhea 06/03/2018  . Irritable bowel syndrome 06/03/2018  . Depression, recurrent ([place]) 01/27/2018  . Arthritis 01/27/2018  . Gastroesophageal reflux disease without esophagitis 01/27/2018  . Flat feet, bilateral 01/27/2018  . Rhinitis, allergic 04/17/2006    Venetta Knee, PTA 07/11/2018, 8:58 AM  [facility] [address], [place], [place], [address] Phone: [phone]   Fax:  [phone]  Name: Journiee Feldkamp MRN: [id number] Date of Birth: [birth year]  Access Code: [email or web address]  URL: [email or web address]  Date: 07/11/2018  Prepared by: Myrene Galas   Exercises  Supine Posterior Pelvic Tilt - 10 reps - 3 sets - 5 hold - 1x daily - 7x weekly  Supine Double Knee to Chest - 10 reps - 3 sets - 5 hold - 1x daily - 7x weekly  Hooklying Single Knee to Chest - 10 reps - 3 sets - 5 hold - 1x daily - 7x weekly  Bridge with Hip Abduction and Resistance - 10 reps - 3 sets - 1x daily - 7x weekly  Supine Bent Leg Lift with Ankle Dorsiflexion - 10 reps - 2 sets - 3 hold - 2x daily - 7x weekly

## 2018-07-15 ENCOUNTER — Telehealth: Payer: Self-pay | Admitting: Family Medicine

## 2018-07-15 ENCOUNTER — Other Ambulatory Visit: Payer: Self-pay | Admitting: Family Medicine

## 2018-07-15 DIAGNOSIS — E559 Vitamin D deficiency, unspecified: Secondary | ICD-10-CM

## 2018-07-15 MED ORDER — FERROUS SULFATE 324 (65 FE) MG PO TBEC: 324.0000 mg | DELAYED_RELEASE_TABLET | Freq: Every day | ORAL | 3 refills | Status: DC

## 2018-07-15 MED ORDER — VITAMIN D (ERGOCALCIFEROL) 1.25 MG (50000 UNIT) PO CAPS: 50000.0000 [IU] | ORAL_CAPSULE | ORAL | 0 refills | Status: DC

## 2018-07-15 MED FILL — VIT D2 1.25 MG (50,000 UNIT: 1.25 MG | 84 days supply | Qty: 12 | Fill #0

## 2018-07-15 NOTE — Telephone Encounter (Signed)
Iron causing stomach upset and constipation and wants to know what other options she has?

## 2018-07-16 ENCOUNTER — Ambulatory Visit: Payer: No Typology Code available for payment source | Admitting: Physical Therapy

## 2018-07-16 ENCOUNTER — Other Ambulatory Visit: Payer: Self-pay

## 2018-07-16 ENCOUNTER — Encounter: Payer: Self-pay | Admitting: Physical Therapy

## 2018-07-16 DIAGNOSIS — M5442 Lumbago with sciatica, left side: Secondary | ICD-10-CM | POA: Diagnosis not present

## 2018-07-16 DIAGNOSIS — G8929 Other chronic pain: Secondary | ICD-10-CM

## 2018-07-16 DIAGNOSIS — M6281 Muscle weakness (generalized): Secondary | ICD-10-CM

## 2018-07-16 NOTE — Telephone Encounter (Signed)
Please advise 

## 2018-07-16 NOTE — Telephone Encounter (Signed)
Pt can try eating iron rich foods (green leafy vegetables, etc) or trying another formulation of iron (ferrous gluconate, etc).

## 2018-07-16 NOTE — Therapy (Signed)
[facility] [address] [place], [place], [address] Phone: [phone]   Fax:  [phone]  Physical Therapy Treatment  Patient Details  Name: Jacqueline Sims MRN: [id number] Date of Birth: 01-Nov-1990 Referring Provider (PT): Billie Ruddy, MD   Encounter Date: 07/16/2018  PT End of Session - 07/16/18 1022    Visit Number  3    Date for PT Re-Evaluation  08/29/18    Authorization Type  [facility] [employer]    PT Start Time  0928    PT Stop Time  1018    PT Time Calculation (min)  50 min    Activity Tolerance  Patient tolerated treatment well       Past Medical History:  Diagnosis Date  . Arthritis   . Asthma   . Chronic low back pain 06/19/2018  . GERD (gastroesophageal reflux disease)   . Hay fever     Past Surgical History:  Procedure Laterality Date  . FLAT FOOT CORRECTION Right 2015    There were no vitals filed for this visit.  Subjective Assessment - 07/16/18 0930    Subjective  Doing OK today and did fine after last visit.  Low back pain is low this morning.  The floor is comfortable than my bed to lie and relax on.  Sitting bothers mid back, lying down bothers low back and hips in sidelying.    Pertinent History  Recent hospitalization (May 2020) due to hyperreflexia/ataxia related to withdrawal symptoms/change of anti-depressant medication, mostly resolved. Rt flat foot correction surgery 2015    How long can you sit comfortably?  15-20 minutes with back pain 4 or 5 /10    How long can you walk comfortably?  limited b/c of foot    Patient Stated Goals  be able to sit at work without pain, sleep on sides    Currently in Pain?  Yes    Pain Score  1     Pain Location  Back    Pain Radiating Towards  lateral right foot 3/10 b/c I work flats yesterday                       [facility] Adult PT Treatment/Exercise - 07/16/18 0001      Therapeutic Activites    Work Simulation  use of lumbar  roll while working and standing/walking every hour, walk a little on lunch hour;  use a ball for thoracic extension as needed       Lumbar Exercises: Aerobic   Nustep  L2 6 min       Lumbar Exercises: Standing   Row  Strengthening;20 reps;Theraband    Theraband Level (Row)  Level 4 (Blue)    Shoulder Extension  Strengthening;Both;20 reps;Theraband    Theraband Level (Shoulder Extension)  Level 4 (Blue)      Lumbar Exercises: Seated   Sit to Stand  10 reps    Sit to Stand Limitations  holding 10#       Lumbar Exercises: Supine   Ab Set  5 reps    Bent Knee Raise  5 reps    Isometric Hip Flexion  5 reps    Other Supine Lumbar Exercises  lying on foam roll deep breathing and side to side rock       Lumbar Exercises: Sidelying   Clam  Right;Left;10 reps      Lumbar Exercises: Quadruped   Single Arm Raise  Right;Left;5 reps  Straight Leg Raise  5 reps    Opposite Arm/Leg Raise  Right arm/Left leg;Left arm/Right leg;5 reps               PT Short Term Goals - 07/04/18 2112      PT SHORT TERM GOAL #1   Title  ind in initial HEP    Time  3    Period  Weeks    Status  New    Target Date  07/25/18      PT SHORT TERM GOAL #2   Title  Pt will demo proper seated posture for improved pain in sitting for driving and work tasks.    Time  3    Period  Weeks    Status  New    Target Date  07/25/18      PT SHORT TERM GOAL #3   Title  Pt will report overall reduction in pain by at least 20%    Time  4    Period  Weeks    Status  New    Target Date  08/01/18        PT Long Term Goals - 07/04/18 2114      PT LONG TERM GOAL #1   Title  Pt will be ind in advanced HEP and understand how to safely progress    Time  8    Period  Weeks    Status  New    Target Date  08/29/18      PT LONG TERM GOAL #2   Title  Pt will report overall reduction in pain by at least 70%    Time  8    Period  Weeks    Status  New    Target Date  08/29/18      PT LONG TERM GOAL #3    Title  Pt will report at least 50% improvement in sleep due to less pain    Time  8    Period  Weeks    Status  New    Target Date  08/29/18      PT LONG TERM GOAL #4   Title  Pt will be able to sit at work for at least 1 hour with pain rating not to exceed 3/10.    Time  8    Period  Weeks    Status  New    Target Date  08/29/18            Plan - 07/16/18 1023    Clinical Impression Statement  The patient presents with low intensity LBP today and no radicular symptoms although her right lateral foot is painful due to a chronic issue in this area.  She is able to perform a progression of lumbo/pelvic/hip core strengthening ex's in sidelying, quadruped and standing with minimal cues for abdominal bracing and to coordinate breathing to avoid holding her breath.  She also needs to cues to avoid hyperextending elbows and knees in weight bearing positions.  She reports her mid back is painful with sitting for work and we discussed lumbar support and the importance of periodic change of position to avoid prolonged static postures.  Prior to the pandemic she enjoyed strength training and dead lifting but this was painful to her low back.  We discussed that as her pain level becomes more under control we would discuss proper lifting technique.    Comorbidities  long history of LBP, possible endometriosis, anxiety, depression    Rehab Potential  Excellent    PT Frequency  2x / week    PT Duration  8 weeks    PT Treatment/Interventions  ADLs/Self Care Home Management;Cryotherapy;Electrical Stimulation;Iontophoresis 4mg /ml Dexamethasone;Moist Heat;Traction;Therapeutic exercise;Neuromuscular re-education;Patient/family education;Manual techniques;Passive range of motion;Dry needling;Taping;Spinal Manipulations;Joint Manipulations    PT Next Visit Plan  core strength in supine, sidelying, quadruped and limited standing and progress to kneeling as able;  Check technique/instruct in  dead lifting at some  point prior to her return to the gym;  limit prolonged weight bearing or SLS secondary to right foot issues    PT Home Exercise Plan  Access Code: [email or web address]       Patient will benefit from skilled therapeutic intervention in order to improve the following deficits and impairments:  Impaired tone, Hypermobility, Pain, Impaired flexibility, Improper body mechanics, Decreased strength, Postural dysfunction  Visit Diagnosis: 1. Chronic bilateral low back pain with bilateral sciatica   2. Muscle weakness (generalized)        Problem List Patient Active Problem List   Diagnosis Date Noted  . Chronic low back pain 06/19/2018  . Hyperreflexia 06/15/2018  . Ataxia   . Neurologic disorder   . Hyperreflexia of lower extremity 06/14/2018  . Mild asthma 06/14/2018  . Hypokalemia 06/14/2018  . Microcytic hypochromic anemia 06/14/2018  . Thrombocytosis ([place]) 06/14/2018  . Rash 06/14/2018  . Leukocytosis 06/14/2018  . Dysmenorrhea 06/03/2018  . Irritable bowel syndrome 06/03/2018  . Depression, recurrent ([place]) 01/27/2018  . Arthritis 01/27/2018  . Gastroesophageal reflux disease without esophagitis 01/27/2018  . Flat feet, bilateral 01/27/2018  . Rhinitis, allergic 04/17/2006   Ruben Im, PT 07/16/18 11:23 AM Phone: [phone] Fax: [phone]  Alvera Singh 07/16/2018, 11:20 AM  [facility] [address] [place], [place], [address] Phone: [phone]   Fax:  [phone]  Name: Heddy Vidana MRN: [id number] Date of Birth: [birth year]

## 2018-07-17 NOTE — Telephone Encounter (Signed)
LVM on pt phone and MyChart portal with Dr Volanda Napoleon advise

## 2018-07-18 ENCOUNTER — Other Ambulatory Visit: Payer: Self-pay

## 2018-07-18 ENCOUNTER — Encounter: Payer: Self-pay | Admitting: Physical Therapy

## 2018-07-18 ENCOUNTER — Ambulatory Visit: Payer: No Typology Code available for payment source | Attending: Family Medicine | Admitting: Physical Therapy

## 2018-07-18 DIAGNOSIS — M6281 Muscle weakness (generalized): Secondary | ICD-10-CM | POA: Insufficient documentation

## 2018-07-18 DIAGNOSIS — M5442 Lumbago with sciatica, left side: Secondary | ICD-10-CM | POA: Diagnosis not present

## 2018-07-18 DIAGNOSIS — G8929 Other chronic pain: Secondary | ICD-10-CM | POA: Insufficient documentation

## 2018-07-18 DIAGNOSIS — M5441 Lumbago with sciatica, right side: Secondary | ICD-10-CM | POA: Diagnosis present

## 2018-07-18 NOTE — Therapy (Signed)
[facility] [address] [place], [place], [address] Phone: [phone]   Fax:  [phone]  Physical Therapy Treatment  Patient Details  Name: Jacqueline Sims MRN: [id number] Date of Birth: [birth year] Referring Provider (PT): Billie Ruddy, MD   Encounter Date: 07/18/2018  PT End of Session - 07/18/18 1657    Visit Number  4    Date for PT Re-Evaluation  08/29/18    Authorization Type  [facility]    PT Start Time  0762    PT Stop Time  1744    PT Time Calculation (min)  46 min    Activity Tolerance  Patient tolerated treatment well    Behavior During Therapy  [facility] for tasks assessed/performed       Past Medical History:  Diagnosis Date  . Arthritis   . Asthma   . Chronic low back pain 06/19/2018  . GERD (gastroesophageal reflux disease)   . Hay fever     Past Surgical History:  Procedure Laterality Date  . FLAT FOOT CORRECTION Right 2015    There were no vitals filed for this visit.  Subjective Assessment - 07/18/18 1657    Subjective  Did better at work today than prior days.  I haven't used a towel roll in sitting at work yet.    Pertinent History  Recent hospitalization (May 2020) due to hyperreflexia/ataxia related to withdrawal symptoms/change of anti-depressant medication, mostly resolved. Rt flat foot correction surgery 2015    Limitations  Sitting;Other (comment);Lifting    How long can you sit comfortably?  15-20 minutes with back pain 4 or 5 /10    How long can you stand comfortably?  unsure    How long can you walk comfortably?  limited b/c of foot    Diagnostic tests  no, will do MRI if PT doesn't help    Patient Stated Goals  be able to sit at work without pain, sleep on sides    Currently in Pain?  Yes    Pain Score  3     Pain Location  Back    Pain Orientation  Lower    Pain Descriptors / Indicators  Aching    Pain Type  Chronic pain    Pain Onset  More than a month ago    Aggravating Factors   sitting, bending, laying on sides has improved somewhat    Effect of Pain on Daily Activities  work, sleep                       [facility] Adult PT Treatment/Exercise - 07/18/18 0001      Exercises   Exercises  Lumbar;Knee/Hip      Lumbar Exercises: Aerobic   Nustep  L3 6 min      Lumbar Exercises: Standing   Row  Strengthening;20 reps;Theraband    Theraband Level (Row)  Level 4 (Blue)    Shoulder Extension  Strengthening;Both;20 reps;Theraband    Theraband Level (Shoulder Extension)  Level 4 (Blue)      Lumbar Exercises: Seated   Other Seated Lumbar Exercises  seated on green ball marching and LAQ with mirror for feedback, d/c'd due to poor control, Pt not ready for this      Lumbar Exercises: Supine   Bent Knee Raise  10 reps    Isometric Hip Flexion  5 reps;5 seconds    Isometric Hip Flexion Limitations  bil    Large Ball Abdominal  Isometric  10 reps;5 seconds    Large Ball Abdominal Isometric Limitations  red ball, VC to draw stomach in as though zipping up tight pants    Other Supine Lumbar Exercises  supine bent knee fallout with core control x 10 reps    Other Supine Lumbar Exercises  heel slides x 10 with pelvic control via core contraction      Lumbar Exercises: Sidelying   Clam  Right;Left;20 reps    Clam Limitations  2x10, PT cued Pt to stack pelvis and use hand to monitor abdominal draw in      Lumbar Exercises: Prone   Other Prone Lumbar Exercises  bil knee bend with VC for TA and TC for bil lumbar multifidi x 5 reps      Lumbar Exercises: Quadruped   Single Arm Raise  Right;Left;5 reps    Straight Leg Raise  5 reps               PT Short Term Goals - 07/18/18 1751      PT SHORT TERM GOAL #1   Title  ind in initial HEP    Status  Achieved      PT SHORT TERM GOAL #2   Title  Pt will demo proper seated posture for improved pain in sitting for driving and work tasks.    Status  On-going      PT SHORT TERM GOAL #3    Title  Pt will report overall reduction in pain by at least 20%    Status  On-going        PT Long Term Goals - 07/04/18 2114      PT LONG TERM GOAL #1   Title  Pt will be ind in advanced HEP and understand how to safely progress    Time  8    Period  Weeks    Status  New    Target Date  08/29/18      PT LONG TERM GOAL #2   Title  Pt will report overall reduction in pain by at least 70%    Time  8    Period  Weeks    Status  New    Target Date  08/29/18      PT LONG TERM GOAL #3   Title  Pt will report at least 50% improvement in sleep due to less pain    Time  8    Period  Weeks    Status  New    Target Date  08/29/18      PT LONG TERM GOAL #4   Title  Pt will be able to sit at work for at least 1 hour with pain rating not to exceed 3/10.    Time  8    Period  Weeks    Status  New    Target Date  08/29/18            Plan - 07/18/18 1744    Clinical Impression Statement  Pt continues to display challenge in lumbopelvic stabilization with any overlay of LE or UE movement.  This challenge was focused in multiple positions including standing, supine, sidelying, prone and quadruped.  Pt improves intiially with TC/VC but then is quick to fatigue.  Added clamshells to HEP today.  PT encouraged Pt to be more pro-active with use of lumbar roll at work which she has not been compliant with.  Her pain level is improving and sleep is less disrupted by pain.  Pt will continue to benefit from skilled PT along current POC.    Rehab Potential  Excellent    PT Frequency  2x / week    PT Duration  8 weeks    PT Treatment/Interventions  ADLs/Self Care Home Management;Cryotherapy;Electrical Stimulation;Iontophoresis 4mg /ml Dexamethasone;Moist Heat;Traction;Therapeutic exercise;Neuromuscular re-education;Patient/family education;Manual techniques;Passive range of motion;Dry needling;Taping;Spinal Manipulations;Joint Manipulations    PT Next Visit Plan  continue core cueing in supine,  sidelying, quadruped, limit standing due to Rt foot pain, seated on ball UE tband, reactive isometrics shoulder for core all directions    PT Home Exercise Plan  Access Code: [email or web address] and Agree with Plan of Care  Patient       Patient will benefit from skilled therapeutic intervention in order to improve the following deficits and impairments:     Visit Diagnosis: 1. Chronic bilateral low back pain with bilateral sciatica   2. Muscle weakness (generalized)        Problem List Patient Active Problem List   Diagnosis Date Noted  . Chronic low back pain 06/19/2018  . Hyperreflexia 06/15/2018  . Ataxia   . Neurologic disorder   . Hyperreflexia of lower extremity 06/14/2018  . Mild asthma 06/14/2018  . Hypokalemia 06/14/2018  . Microcytic hypochromic anemia 06/14/2018  . Thrombocytosis ([place]) 06/14/2018  . Rash 06/14/2018  . Leukocytosis 06/14/2018  . Dysmenorrhea 06/03/2018  . Irritable bowel syndrome 06/03/2018  . Depression, recurrent ([place]) 01/27/2018  . Arthritis 01/27/2018  . Gastroesophageal reflux disease without esophagitis 01/27/2018  . Flat feet, bilateral 01/27/2018  . Rhinitis, allergic 04/17/2006    Baruch Merl, PT 07/18/18 5:52 PM   [facility] [address] [place], [place], [address] Phone: [phone]   Fax:  [phone]  Name: Jacqueline Sims MRN: [id number] Date of Birth: December 10, 1990

## 2018-07-23 ENCOUNTER — Encounter: Payer: Self-pay | Admitting: Physical Therapy

## 2018-07-23 ENCOUNTER — Other Ambulatory Visit: Payer: Self-pay

## 2018-07-23 ENCOUNTER — Ambulatory Visit: Payer: No Typology Code available for payment source | Admitting: Physical Therapy

## 2018-07-23 DIAGNOSIS — M6281 Muscle weakness (generalized): Secondary | ICD-10-CM

## 2018-07-23 DIAGNOSIS — M5442 Lumbago with sciatica, left side: Secondary | ICD-10-CM | POA: Diagnosis not present

## 2018-07-23 DIAGNOSIS — G8929 Other chronic pain: Secondary | ICD-10-CM

## 2018-07-23 NOTE — Therapy (Signed)
[facility] [address] [place], [place], [address] Phone: [phone]   Fax:  [phone]  Physical Therapy Treatment  Patient Details  Name: Jacqueline Sims MRN: [id number] Date of Birth: 1990/11/04 Referring Provider (PT): Billie Ruddy, MD   Encounter Date: 07/23/2018  PT End of Session - 07/23/18 1603    Visit Number  5    Date for PT Re-Evaluation  08/29/18    Authorization Type  [facility]    PT Start Time  1600    PT Stop Time  1645    PT Time Calculation (min)  45 min    Activity Tolerance  Patient tolerated treatment well    Behavior During Therapy  [facility] for tasks assessed/performed       Past Medical History:  Diagnosis Date  . Arthritis   . Asthma   . Chronic low back pain 06/19/2018  . GERD (gastroesophageal reflux disease)   . Hay fever     Past Surgical History:  Procedure Laterality Date  . FLAT FOOT CORRECTION Right 2015    There were no vitals filed for this visit.  Subjective Assessment - 07/23/18 1559    Subjective  Pt states she feels about 25% improvement since starting PT.  Sleep has improved as well.  Was able to sit for 45 min at work today which is better than it has been.  HEP going well although only doing it 3-4 days/week.    Pertinent History  Recent hospitalization (May 2020) due to hyperreflexia/ataxia related to withdrawal symptoms/change of anti-depressant medication, mostly resolved. Rt flat foot correction surgery 2015    How long can you sit comfortably?  45 min    How long can you stand comfortably?  unsure    How long can you walk comfortably?  limited b/c of foot    Diagnostic tests  no, will do MRI if PT doesn't help    Patient Stated Goals  be able to sit at work without pain, sleep on sides    Currently in Pain?  Yes    Pain Score  2     Pain Location  Buttocks    Pain Orientation  Right;Left    Pain Descriptors / Indicators  Aching    Pain Type   Chronic pain    Pain Onset  More than a month ago    Pain Frequency  Intermittent    Aggravating Factors   sitting    Pain Relieving Factors  heat    Effect of Pain on Daily Activities  work, sleep                       [facility] Adult PT Treatment/Exercise - 07/23/18 0001      Exercises   Exercises  Lumbar;Knee/Hip      Lumbar Exercises: Stretches   Quad Stretch  Left;Right;30 seconds    Quad Stretch Limitations  added to HEP      Lumbar Exercises: Aerobic   Nustep  L3 x 7 min, PT present to review goals      Lumbar Exercises: Standing   Other Standing Lumbar Exercises  cable pulley held at central abdomen lateral walk outs x 5 each with cue to use hip abductors and core x 5 each direction, 20#      Lumbar Exercises: Seated   Sit to Stand Limitations  holding 10# x 15 reps    Other Seated Lumbar Exercises  seated on  blue ball pulley shoulder extension with core cueing 25# x 20 reps    Other Seated Lumbar Exercises  seated on blue ball pulley shoulder row with core cueing 30# x 20 reps      Lumbar Exercises: Supine   Bent Knee Raise  10 reps    Bent Knee Raise Limitations  slow for proper muscle recruitment    Other Supine Lumbar Exercises  supine bent knee fallout with core control x 20 reps    Other Supine Lumbar Exercises  heel slides x 10 with pelvic control via core contraction      Lumbar Exercises: Sidelying   Clam  Right;Left;20 reps    Clam Limitations  green band added             PT Education - 07/23/18 1647    Education Details  Access Code: [email or web address]    Person(s) Educated  Patient    Methods  Explanation;Demonstration;Verbal cues;Handout    Comprehension  Verbalized understanding;Returned demonstration       PT Short Term Goals - 07/23/18 1603      PT SHORT TERM GOAL #1   Title  ind in initial HEP    Status  Achieved      PT SHORT TERM GOAL #2   Title  Pt will demo proper seated posture for improved pain in sitting for driving and  work tasks.    Status  Achieved      PT SHORT TERM GOAL #3   Title  Pt will report overall reduction in pain by at least 20%    Status  Achieved        PT Long Term Goals - 07/23/18 1604      PT LONG TERM GOAL #3   Title  Pt will report at least 50% improvement in sleep due to less pain    Baseline  15% improvement    Status  On-going      PT LONG TERM GOAL #4   Title  Pt will be able to sit at work for at least 1 hour with pain rating not to exceed 3/10.    Baseline  45 min    Status  On-going            Plan - 07/23/18 1648    Clinical Impression Statement  Pt reports 25% improvment to date with PT.  She has met all STGs.  She is demonstrating improving core awareness.  She struggles to stabilize left hemipelvis > right but this is showing improvement in supine with LE ROM overlay and cues to move more slowly to re-train anticipatory activation.  Updated HEP to include resistance with clams, quad stretch and standing tband postural strength for upper back and UEs.  She will continue to benefit from skilled progression of core stabilization and LE strength working toward less pain with functional demands of daily tasks.    Rehab Potential  Excellent    PT Frequency  2x / week    PT Duration  8 weeks    PT Treatment/Interventions  ADLs/Self Care Home Management;Cryotherapy;Electrical Stimulation;Iontophoresis 60m/ml Dexamethasone;Moist Heat;Traction;Therapeutic exercise;Neuromuscular re-education;Patient/family education;Manual techniques;Passive range of motion;Dry needling;Taping;Spinal Manipulations;Joint Manipulations    PT Next Visit Plan  add hip abduction in sidelying, f/u on updated HEP, continue lumbopelvic stabilization in quadruped, supine and sidelying, trial knee plank or countertop plank    PT Home Exercise Plan  Access Code: [email or web address]   Consulted and Agree with Plan of Care  Patient  Patient will benefit from skilled therapeutic intervention in order to  improve the following deficits and impairments:     Visit Diagnosis: 1. Chronic bilateral low back pain with bilateral sciatica   2. Muscle weakness (generalized)        Problem List Patient Active Problem List   Diagnosis Date Noted  . Chronic low back pain 06/19/2018  . Hyperreflexia 06/15/2018  . Ataxia   . Neurologic disorder   . Hyperreflexia of lower extremity 06/14/2018  . Mild asthma 06/14/2018  . Hypokalemia 06/14/2018  . Microcytic hypochromic anemia 06/14/2018  . Thrombocytosis ([place]) 06/14/2018  . Rash 06/14/2018  . Leukocytosis 06/14/2018  . Dysmenorrhea 06/03/2018  . Irritable bowel syndrome 06/03/2018  . Depression, recurrent ([place]) 01/27/2018  . Arthritis 01/27/2018  . Gastroesophageal reflux disease without esophagitis 01/27/2018  . Flat feet, bilateral 01/27/2018  . Rhinitis, allergic 04/17/2006    Baruch Merl, PT 07/23/18 4:54 PM    [facility] [address], [place], [place], [address] Phone: [phone]   Fax:  [phone]  Name: Jacqueline Sims MRN: [id number] Date of Birth: Jan 03, 1991

## 2018-07-23 NOTE — Patient Instructions (Signed)
Access Code: [email or web address]  URL: [email or web address]  Date: 07/23/2018  Prepared by: Venetia Night Celie Desrochers   Exercises  Supine Posterior Pelvic Tilt - 10 reps - 3 sets - 5 hold - 1x daily - 7x weekly  Supine Double Knee to Chest - 10 reps - 3 sets - 5 hold - 1x daily - 7x weekly  Hooklying Single Knee to Chest - 10 reps - 3 sets - 5 hold - 1x daily - 7x weekly  Bridge with Hip Abduction and Resistance - 10 reps - 3 sets - 1x daily - 7x weekly  Supine Bent Leg Lift with Ankle Dorsiflexion - 10 reps - 2 sets - 3 hold - 2x daily - 7x weekly  Clamshell with Resistance - 10 reps - 3 sets - 1x daily - 7x weekly  Sidelying Quad Stretch - 10 reps - 3 sets - 30 hold - 1x daily - 7x weekly  Shoulder extension with resistance - Neutral - 10 reps - 3 sets - 1x daily - 7x weekly  Squatting Shoulder Row with Anchored Resistance - 10 reps - 3 sets - 1x daily - 7x weekly

## 2018-07-25 ENCOUNTER — Ambulatory Visit: Payer: No Typology Code available for payment source | Admitting: Physical Therapy

## 2018-07-25 ENCOUNTER — Encounter: Payer: Self-pay | Admitting: Physical Therapy

## 2018-07-25 ENCOUNTER — Other Ambulatory Visit: Payer: Self-pay

## 2018-07-25 DIAGNOSIS — M6281 Muscle weakness (generalized): Secondary | ICD-10-CM

## 2018-07-25 DIAGNOSIS — M5442 Lumbago with sciatica, left side: Secondary | ICD-10-CM | POA: Diagnosis not present

## 2018-07-25 DIAGNOSIS — G8929 Other chronic pain: Secondary | ICD-10-CM

## 2018-07-25 NOTE — Therapy (Signed)
[facility] [address] [place], [place], [address] Phone: [phone]   Fax:  [phone]  Physical Therapy Treatment  Patient Details  Name: Jacqueline Sims MRN: [id number] Date of Birth: May 25, 1990 Referring Provider (PT): Billie Ruddy, MD   Encounter Date: 07/25/2018  PT End of Session - 07/25/18 1702    Visit Number  6    Date for PT Re-Evaluation  08/29/18    Authorization Type  [facility] [employer]    PT Start Time  1655    PT Stop Time  1735    PT Time Calculation (min)  40 min    Activity Tolerance  Patient tolerated treatment well    Behavior During Therapy  [facility] for tasks assessed/performed       Past Medical History:  Diagnosis Date  . Arthritis   . Asthma   . Chronic low back pain 06/19/2018  . GERD (gastroesophageal reflux disease)   . Hay fever     Past Surgical History:  Procedure Laterality Date  . FLAT FOOT CORRECTION Right 2015    There were no vitals filed for this visit.  Subjective Assessment - 07/25/18 1657    Subjective  Pt states her menstrual cycle started yesterday which always gives her exacerbation of pain.  Last night pain radiated down both legs.  Pain isn't as bad as during past cycles but pain is up a bit.  Otherwise did fine after last visit and have done the updated exercises without a problem other than the blue band snapped.  I think I need a black one.    Pertinent History  Recent hospitalization (May 2020) due to hyperreflexia/ataxia related to withdrawal symptoms/change of anti-depressant medication, mostly resolved. Rt flat foot correction surgery 2015    Limitations  Sitting;Other (comment);Lifting    How long can you sit comfortably?  45 min    How long can you stand comfortably?  unsure    How long can you walk comfortably?  limited b/c of foot    Diagnostic tests  no, will do MRI if PT doesn't help    Patient Stated Goals  be able to sit at work without pain, sleep  on sides    Currently in Pain?  Yes    Pain Score  5     Pain Location  Back    Pain Orientation  Right;Left    Pain Descriptors / Indicators  Aching    Pain Type  Chronic pain    Pain Radiating Towards  no radiating today    Pain Onset  More than a month ago    Pain Frequency  Intermittent    Aggravating Factors   sitting, sleep    Pain Relieving Factors  heat    Effect of Pain on Daily Activities  work, sleep                       [facility] Adult PT Treatment/Exercise - 07/25/18 0001      Lumbar Exercises: Stretches   Sports administrator  Right;Left;2 reps;30 seconds    Other Lumbar Stretch Exercise  child's pose with hands on foam roller, child's pose with sidebending bil, 1x20 each   exacerbated abdominal cramping related to menstrual cycle     Lumbar Exercises: Aerobic   Nustep  L3 x 8', PT present to discuss symptoms and HEP      Lumbar Exercises: Standing   Other Standing Lumbar Exercises  2x20 wall sits  with black band around knees for hip abd isometric    Other Standing Lumbar Exercises  cable pulley shoulder extension (30#) and row (45#) seated on blue ball x 20 each      Lumbar Exercises: Supine   Pelvic Tilt  10 reps    Bent Knee Raise  10 reps    Other Supine Lumbar Exercises  bent knee fallout 10 reps each      Lumbar Exercises: Sidelying   Hip Abduction  Both;20 reps    Hip Abduction Limitations  2x10 with cue to reach leg long to stack pelvis, engage TrA      Knee/Hip Exercises: Standing   Other Standing Knee Exercises  standing plank with hands on countertop height mat table with small range alt hip exension with focus on pelvic stability x 10 reps               PT Short Term Goals - 07/23/18 1603      PT SHORT TERM GOAL #1   Title  ind in initial HEP    Status  Achieved      PT SHORT TERM GOAL #2   Title  Pt will demo proper seated posture for improved pain in sitting for driving and work tasks.    Status  Achieved      PT SHORT TERM  GOAL #3   Title  Pt will report overall reduction in pain by at least 20%    Status  Achieved        PT Long Term Goals - 07/23/18 1604      PT LONG TERM GOAL #3   Title  Pt will report at least 50% improvement in sleep due to less pain    Baseline  15% improvement    Status  On-going      PT LONG TERM GOAL #4   Title  Pt will be able to sit at work for at least 1 hour with pain rating not to exceed 3/10.    Baseline  45 min    Status  On-going            Plan - 07/25/18 1736    Clinical Impression Statement  Pt reports she has improved overall by 25% during the day and about 15% for improved sleep.  Her typical pattern of pain is worse with her menstrual cycle which started two days ago.  Pain rating was up at 5/10 on arrival but with exercise today went down to 3/10.  She demonstrates improvement in Lt pelvic control during supine bent knee march from prior visits.  She tolerated new dynamic ther ex with good form and no increase in pain.  She is compliant in HEP.  She will continue to benefit from skilled progression of lumbopelvic and hip stabilization with dynamic overlay as she gains proximal control.    Comorbidities  long history of LBP, possible endometriosis, anxiety, depression    Rehab Potential  Excellent    PT Frequency  2x / week    PT Duration  8 weeks    PT Treatment/Interventions  ADLs/Self Care Home Management;Cryotherapy;Electrical Stimulation;Iontophoresis 4mg /ml Dexamethasone;Moist Heat;Traction;Therapeutic exercise;Neuromuscular re-education;Patient/family education;Manual techniques;Passive range of motion;Dry needling;Taping;Spinal Manipulations;Joint Manipulations    PT Next Visit Plan  update HEP to add sidelying hip abduction from today's visit, limit standing ther ex due to Rt foot pain, continue stabilization and strengthening, trial planks on knees and in sidelying    PT Home Exercise Plan  Access Code: [email or web address]  Consulted and Agree with Plan of  Care  Patient       Patient will benefit from skilled therapeutic intervention in order to improve the following deficits and impairments:     Visit Diagnosis: 1. Chronic bilateral low back pain with bilateral sciatica   2. Muscle weakness (generalized)        Problem List Patient Active Problem List   Diagnosis Date Noted  . Chronic low back pain 06/19/2018  . Hyperreflexia 06/15/2018  . Ataxia   . Neurologic disorder   . Hyperreflexia of lower extremity 06/14/2018  . Mild asthma 06/14/2018  . Hypokalemia 06/14/2018  . Microcytic hypochromic anemia 06/14/2018  . Thrombocytosis ([place]) 06/14/2018  . Rash 06/14/2018  . Leukocytosis 06/14/2018  . Dysmenorrhea 06/03/2018  . Irritable bowel syndrome 06/03/2018  . Depression, recurrent ([place]) 01/27/2018  . Arthritis 01/27/2018  . Gastroesophageal reflux disease without esophagitis 01/27/2018  . Flat feet, bilateral 01/27/2018  . Rhinitis, allergic 04/17/2006    Baruch Merl, PT 07/25/18 5:44 PM   [facility] [address], [place], [place], [address] Phone: [phone]   Fax:  [phone]  Name: Jacqueline Sims MRN: [id number] Date of Birth: 1990-04-12

## 2018-07-30 ENCOUNTER — Ambulatory Visit: Payer: No Typology Code available for payment source | Admitting: Physical Therapy

## 2018-08-01 ENCOUNTER — Ambulatory Visit: Payer: No Typology Code available for payment source | Admitting: Podiatry

## 2018-08-01 ENCOUNTER — Ambulatory Visit: Payer: No Typology Code available for payment source | Admitting: Physical Therapy

## 2018-08-01 ENCOUNTER — Other Ambulatory Visit: Payer: Self-pay

## 2018-08-01 ENCOUNTER — Encounter: Payer: Self-pay | Admitting: Physical Therapy

## 2018-08-01 VITALS — Temp 97.4°F

## 2018-08-01 DIAGNOSIS — M25571 Pain in right ankle and joints of right foot: Secondary | ICD-10-CM | POA: Diagnosis not present

## 2018-08-01 DIAGNOSIS — G8929 Other chronic pain: Secondary | ICD-10-CM

## 2018-08-01 DIAGNOSIS — M6281 Muscle weakness (generalized): Secondary | ICD-10-CM

## 2018-08-01 DIAGNOSIS — M5442 Lumbago with sciatica, left side: Secondary | ICD-10-CM | POA: Diagnosis not present

## 2018-08-01 NOTE — Therapy (Signed)
[facility] [address] [place], [place], [address] Phone: [phone]   Fax:  [phone]  Physical Therapy Treatment  Patient Details  Name: Jacqueline Sims MRN: [id number] Date of Birth: Oct 19, 1990 Referring Provider (PT): Billie Ruddy, MD   Encounter Date: 08/01/2018  PT End of Session - 08/01/18 1702    Visit Number  7    Date for PT Re-Evaluation  08/29/18    Authorization Type  [facility]    PT Start Time  1700    PT Stop Time  1738    PT Time Calculation (min)  38 min    Activity Tolerance  Patient tolerated treatment well    Behavior During Therapy  [facility] for tasks assessed/performed       Past Medical History:  Diagnosis Date  . Arthritis   . Asthma   . Chronic low back pain 06/19/2018  . GERD (gastroesophageal reflux disease)   . Hay fever     Past Surgical History:  Procedure Laterality Date  . FLAT FOOT CORRECTION Right 2015    There were no vitals filed for this visit.  Subjective Assessment - 08/01/18 1657    Subjective  I was going pretty good until today when my pain went up a little bit in my back.  My legs still feel achey too.  3/10 for pain.    Pertinent History  Recent hospitalization (May 2020) due to hyperreflexia/ataxia related to withdrawal symptoms/change of anti-depressant medication, mostly resolved. Rt flat foot correction surgery 2015    Limitations  Sitting;Other (comment);Lifting    How long can you sit comfortably?  45 min    How long can you stand comfortably?  unsure    How long can you walk comfortably?  limited b/c of foot    Diagnostic tests  no, will do MRI if PT doesn't help    Patient Stated Goals  be able to sit at work without pain, sleep on sides    Currently in Pain?  Yes    Pain Score  3     Pain Location  Back    Pain Orientation  Mid;Lower    Pain Descriptors / Indicators  Aching    Pain Type  Chronic pain    Pain Onset  More than a month ago     Pain Frequency  Intermittent    Aggravating Factors   sitting, sleep    Pain Relieving Factors  heat    Effect of Pain on Daily Activities  work, sleep                       [facility] Adult PT Treatment/Exercise - 08/01/18 0001      Lumbar Exercises: Aerobic   Nustep  L4 x 8', PT present to assess LTG progress      Lumbar Exercises: Machines for Strengthening   Other Lumbar Machine Exercise  seated on blue ball: cable pulley high to low across body bil 25#, shoulder extension 35# x 20, bil shoulder row 45# x 20      Lumbar Exercises: Supine   Pelvic Tilt  15 reps    Bent Knee Raise  10 reps    Other Supine Lumbar Exercises  bent knee fallout 10 reps each      Lumbar Exercises: Sidelying   Clam  15 reps;Both    Clam Limitations  red band    Hip Abduction Limitations  2x10 with cue to reach  leg long to stack pelvis, engage TrA      Lumbar Exercises: Quadruped   Straight Leg Raise  10 reps    Straight Leg Raises Limitations  PT cued level pelvis and TrA drawing in      Knee/Hip Exercises: Supine   Bridges  Strengthening;Both;3 sets;5 reps    Bridges Limitations  feet on green ball    Other Supine Knee/Hip Exercises  hamstring curls on green ball bil x 20 reps               PT Short Term Goals - 07/23/18 1603      PT SHORT TERM GOAL #1   Title  ind in initial HEP    Status  Achieved      PT SHORT TERM GOAL #2   Title  Pt will demo proper seated posture for improved pain in sitting for driving and work tasks.    Status  Achieved      PT SHORT TERM GOAL #3   Title  Pt will report overall reduction in pain by at least 20%    Status  Achieved        PT Long Term Goals - 08/01/18 1703      PT LONG TERM GOAL #1   Title  Pt will be ind in advanced HEP and understand how to safely progress    Status  On-going      PT LONG TERM GOAL #2   Title  Pt will report overall reduction in pain by at least 70%    Baseline  30-40%    Status  On-going       PT LONG TERM GOAL #3   Title  Pt will report at least 50% improvement in sleep due to less pain    Baseline  15%    Status  On-going      PT LONG TERM GOAL #4   Title  Pt will be able to sit at work for at least 1 hour with pain rating not to exceed 3/10.    Status  On-going            Plan - 08/01/18 1737    Clinical Impression Statement  Pt reports continuing improvement reaching 30-40% less pain since starting PT.  Pt is demonstrating improved recruitment and control proximally with progressive resistance exercise and dynamic challenges.  She is limited in standing exercises due to chronic Rt foot pain so much glute exercise is being done in open chain in quadruped, sidelying and supine.  She will continue to benefit from skilled progression of stabilization and ther ex along current POC.    Comorbidities  long history of LBP, possible endometriosis, anxiety, depression    Rehab Potential  Excellent    PT Frequency  2x / week    PT Duration  8 weeks    PT Treatment/Interventions  ADLs/Self Care Home Management;Cryotherapy;Electrical Stimulation;Iontophoresis 4mg /ml Dexamethasone;Moist Heat;Traction;Therapeutic exercise;Neuromuscular re-education;Patient/family education;Manual techniques;Passive range of motion;Dry needling;Taping;Spinal Manipulations;Joint Manipulations    PT Next Visit Plan  progress HEP, limit standing ther ex due to Rt foot pain, continue stabilization and strengthening, trial planks on knees and in sidelying    PT Home Exercise Plan  Access Code: [email or web address]    Consulted and Agree with Plan of Care  Patient       Patient will benefit from skilled therapeutic intervention in order to improve the following deficits and impairments:     Visit Diagnosis: 1. Chronic bilateral low back pain with  bilateral sciatica   2. Muscle weakness (generalized)        Problem List Patient Active Problem List   Diagnosis Date Noted  . Chronic low back pain 06/19/2018  .  Hyperreflexia 06/15/2018  . Ataxia   . Neurologic disorder   . Hyperreflexia of lower extremity 06/14/2018  . Mild asthma 06/14/2018  . Hypokalemia 06/14/2018  . Microcytic hypochromic anemia 06/14/2018  . Thrombocytosis ([place]) 06/14/2018  . Rash 06/14/2018  . Leukocytosis 06/14/2018  . Dysmenorrhea 06/03/2018  . Irritable bowel syndrome 06/03/2018  . Depression, recurrent ([place]) 01/27/2018  . Arthritis 01/27/2018  . Gastroesophageal reflux disease without esophagitis 01/27/2018  . Flat feet, bilateral 01/27/2018  . Rhinitis, allergic 04/17/2006    Baruch Merl, PT 08/01/18 5:44 PM   [facility] [address], [place], [place], [address] Phone: [phone]   Fax:  [phone]  Name: Jacqueline Sims MRN: [id number] Date of Birth: November 02, 1990

## 2018-08-05 ENCOUNTER — Ambulatory Visit: Payer: No Typology Code available for payment source | Admitting: Physical Therapy

## 2018-08-08 ENCOUNTER — Other Ambulatory Visit: Payer: Self-pay

## 2018-08-08 ENCOUNTER — Ambulatory Visit: Payer: No Typology Code available for payment source | Admitting: Physical Therapy

## 2018-08-08 ENCOUNTER — Encounter: Payer: Self-pay | Admitting: Physical Therapy

## 2018-08-08 DIAGNOSIS — M5442 Lumbago with sciatica, left side: Secondary | ICD-10-CM | POA: Diagnosis not present

## 2018-08-08 DIAGNOSIS — G8929 Other chronic pain: Secondary | ICD-10-CM

## 2018-08-08 DIAGNOSIS — M6281 Muscle weakness (generalized): Secondary | ICD-10-CM

## 2018-08-08 NOTE — Therapy (Addendum)
[facility] [address], [place], [place], [address] Phone: [phone]   Fax:  [phone]  Physical Therapy Treatment  Patient Details  Name: Jacqueline Sims MRN: [id number] Date of Birth: [birth year] Referring Provider (PT): Billie Ruddy, MD   Encounter Date: 08/08/2018  PT End of Session - 08/08/18 1655    Visit Number  8    Date for PT Re-Evaluation  08/29/18    Authorization Type  [facility]    PT Start Time  1656    PT Stop Time  1738    PT Time Calculation (min)  42 min    Activity Tolerance  Patient tolerated treatment well    Behavior During Therapy  [facility] for tasks assessed/performed       Past Medical History:  Diagnosis Date  . Arthritis   . Asthma   . Chronic low back pain 06/19/2018  . GERD (gastroesophageal reflux disease)   . Hay fever     Past Surgical History:  Procedure Laterality Date  . FLAT FOOT CORRECTION Right 2015    There were no vitals filed for this visit.  Subjective Assessment - 08/08/18 1702    Subjective  Pt states the left hip hurts more when she is lying around which she has been doing this last week.  States she is not having pain currently.  She reports feeling 50-60% improved    Patient Stated Goals  be able to sit at work without pain, sleep on sides    Currently in Pain?  No/denies                       [facility] Adult PT Treatment/Exercise - 08/08/18 0001      Lumbar Exercises: Aerobic   UBE (Upper Arm Bike)  L2 3 x 3 sitting      Lumbar Exercises: Seated   Long Arc Quad on [place]  Strengthening;Right;Left;20 reps;Weights    LAQ on [employer] (lbs)  --   1.5   Hip Flexion on Ball  Strengthening;Right;Left;10 reps    Hip Flexion on Ball Limitations  alt with 1.5 ankle weights and 2 lb hand weights - UE/LE alt flexion      Lumbar Exercises: Supine   Bent Knee Raise  10 reps    Bridge  --    Bridge Limitations  slow and controlled - cued  to let everything relax in between reps    Bridge with [employer]  20 reps    Straight Leg Raise  10 reps    Straight Leg Raises Limitations  1.5 lb; slow and controlled    Other Supine Lumbar Exercises  supine on foam roller vertical under pelvis - rocking side to side 20x; alternating toe touch down 20x      Lumbar Exercises: Sidelying   Clam  15 reps;Both    Clam Limitations  green band added      Lumbar Exercises: Quadruped   Straight Leg Raise  10 reps    Straight Leg Raises Limitations  PT cued level pelvis and TrA drawing in               PT Short Term Goals - 07/23/18 1603      PT SHORT TERM GOAL #1   Title  ind in initial HEP    Status  Achieved      PT SHORT TERM GOAL #2   Title  Pt will demo proper seated posture  for improved pain in sitting for driving and work tasks.    Status  Achieved      PT SHORT TERM GOAL #3   Title  Pt will report overall reduction in pain by at least 20%    Status  Achieved        PT Long Term Goals - 08/08/18 1755      PT LONG TERM GOAL #1   Title  Pt will be ind in advanced HEP and understand how to safely progress    Status  On-going      PT LONG TERM GOAL #2   Title  Pt will report overall reduction in pain by at least 70%    Baseline  50-60% improved    Status  On-going            Plan - 08/08/18 1753    Clinical Impression Statement  Today's session continued to focus on core strength for improved stability.  Emphasis was on exercises that challenged her balance with the ball and foam roller.  She was also able to add increased resistance with ankle weights.  Pt will continue to benefit from skilled PT according to current POC.    PT Treatment/Interventions  ADLs/Self Care Home Management;Cryotherapy;Electrical Stimulation;Iontophoresis 24m/ml Dexamethasone;Moist Heat;Traction;Therapeutic exercise;Neuromuscular re-education;Patient/family education;Manual techniques;Passive range of motion;Dry  needling;Taping;Spinal Manipulations;Joint Manipulations    PT Next Visit Plan  progress HEP, limit standing ther ex due to Rt foot pain, continue stabilization and strengthening, trial planks on knees and in sidelying    PT Home Exercise Plan  Access Code: [email or web address]   Consulted and Agree with Plan of Care  Patient       Patient will benefit from skilled therapeutic intervention in order to improve the following deficits and impairments:  Impaired tone, Hypermobility, Pain, Impaired flexibility, Improper body mechanics, Decreased strength, Postural dysfunction  Visit Diagnosis: 1. Chronic bilateral low back pain with bilateral sciatica   2. Muscle weakness (generalized)        Problem List Patient Active Problem List   Diagnosis Date Noted  . Chronic low back pain 06/19/2018  . Hyperreflexia 06/15/2018  . Ataxia   . Neurologic disorder   . Hyperreflexia of lower extremity 06/14/2018  . Mild asthma 06/14/2018  . Hypokalemia 06/14/2018  . Microcytic hypochromic anemia 06/14/2018  . Thrombocytosis ([place] 06/14/2018  . Rash 06/14/2018  . Leukocytosis 06/14/2018  . Dysmenorrhea 06/03/2018  . Irritable bowel syndrome 06/03/2018  . Depression, recurrent ([place] 01/27/2018  . Arthritis 01/27/2018  . Gastroesophageal reflux disease without esophagitis 01/27/2018  . Flat feet, bilateral 01/27/2018  . Rhinitis, allergic 04/17/2006    JJule Ser PT 08/08/2018, 5:57 PM  PHYSICAL THERAPY DISCHARGE SUMMARY  Visits from Start of Care: 8  Current functional level related to goals / functional outcomes: See above   Remaining deficits: See above   Education / Equipment: HEP Plan: Patient agrees to discharge.  Patient goals were partially met. Patient is being discharged due to being pleased with the current functional level.  ?????         JBaruch Merl PT 09/16/18 8:36 AM    [facility] [address]  [place] [address]: [phone]  Fax:  [phone] Name: Jacqueline BachmannMRN: [id number] of Birth: 5September 19, 1992

## 2018-08-13 ENCOUNTER — Ambulatory Visit: Payer: No Typology Code available for payment source | Admitting: Physical Therapy

## 2018-08-13 ENCOUNTER — Telehealth: Payer: Self-pay | Admitting: Physical Therapy

## 2018-08-13 NOTE — Telephone Encounter (Signed)
PT called and left a message about missed appointment.  PT asked Pt to call back to confirm next appointment on Thurs 08/15/18 at 6pm.  PT also informed Pt that 08/15/18 is her last scheduled PT visit but her certification goes through 08/29/18 should she wish to schedule more visits.   Johanna Beuhring, PT 08/13/18 5:20 PM

## 2018-08-15 ENCOUNTER — Ambulatory Visit: Payer: No Typology Code available for payment source | Admitting: Physical Therapy

## 2018-08-19 NOTE — Progress Notes (Signed)
  Subjective:  Patient ID: Jacqueline Sims, female    DOB: Nov 12, 1990,  MRN: [id number]  Chief Complaint  Patient presents with  . Ankle Pain    Right ankle pain has returned as of 2 months ago. Pt requests injections.   28 y.o. female presents with the above complaint.  History as above  Review of Systems: Negative except as noted in the HPI. Denies N/V/F/Ch.  Past Medical History:  Diagnosis Date  . Arthritis   . Asthma   . Chronic low back pain 06/19/2018  . GERD (gastroesophageal reflux disease)   . Hay fever     Current Outpatient Medications:  .  albuterol (PROVENTIL HFA;VENTOLIN HFA) 108 (90 Base) MCG/ACT inhaler, Inhale 2 puffs into the lungs every 6 (six) hours as needed for wheezing or shortness of breath., Disp: 1 Inhaler, Rfl: 0 .  azelastine (ASTELIN) 0.1 % nasal spray, Place 1 spray into both nostrils daily. (Patient taking differently: Place 1 spray into both nostrils daily as needed for allergies. ), Disp: 30 mL, Rfl: 4 .  ferrous sulfate 324 (65 Fe) MG TBEC, Take 1 tablet (324 mg total) by mouth daily., Disp: 30 tablet, Rfl: 3 .  gabapentin (NEURONTIN) 100 MG capsule, Take 1 capsule (100 mg total) by mouth 3 (three) times daily., Disp: 90 capsule, Rfl: 3 .  norgestimate-ethinyl estradiol (ORTHO-CYCLEN,SPRINTEC,PREVIFEM) 0.25-35 MG-MCG tablet, Take 1 tablet by mouth daily. , Disp: , Rfl:  .  rOPINIRole (REQUIP) 0.25 MG tablet, ropinirole 0.25 mg tablet, Disp: , Rfl:  .  traMADol (ULTRAM) 50 MG tablet, , Disp: , Rfl:  .  Vitamin D, Ergocalciferol, (DRISDOL) 1.25 MG (50000 UT) CAPS capsule, Take 1 capsule (50,000 Units total) by mouth every 7 (seven) days., Disp: 12 capsule, Rfl: 0 .  FLUoxetine (PROZAC) 20 MG capsule, Take 1 capsule (20 mg total) by mouth daily. Resume when tremors and hyperreflexia have resolved. (Patient not taking: Reported on 08/01/2018), Disp: , Rfl: 3  Social History   Tobacco Use  Smoking Status Never Smoker  Smokeless Tobacco Never Used     Allergies  Allergen Reactions  . Lactose Nausea And Vomiting   Objective:   Vitals:   08/01/18 0946  Temp: (!) 97.4 F (36.3 C)   There is no height or weight on file to calculate BMI. Constitutional Well developed. Well nourished.  Vascular Dorsalis pedis pulses palpable bilaterally. Posterior tibial pulses palpable bilaterally. Capillary refill normal to all digits.  No cyanosis or clubbing noted. Pedal hair growth normal.  Neurologic Normal speech. Oriented to person, place, and time. Epicritic sensation to light touch grossly present bilaterally.  Dermatologic Nails well groomed and normal in appearance. No open wounds. No skin lesions. Well-healed incisions right foot  Orthopedic: Normal joint ROM without pain or crepitus bilaterally. No visible deformities. Pain palpation about the right sinus tarsi   Radiographs: None Assessment:   1. Sinus tarsi syndrome of right ankle    Plan:  Patient was evaluated and treated and all questions answered.  Sinus tarsitis right, concomitant flatfoot s/p correction -Repeat sinus tarsi injection as below  Procedure: Injection Intermediate Joint Consent: Verbal consent obtained. Location: Right sinus tarsi. Skin Prep: alcohol. Injectate: 1 cc 0.5% marcaine plain, 1 cc dexamethasone phosphate, 0.5 cc kenalog 10. Disposition: Patient tolerated procedure well. Injection site dressed with a band-aid.    No follow-ups on file.

## 2018-08-21 MED FILL — ALYACEN 1-35-28 TABLET: 1-35 | 84 days supply | Qty: 84 | Fill #1

## 2018-08-28 ENCOUNTER — Other Ambulatory Visit: Payer: Self-pay

## 2018-08-28 ENCOUNTER — Ambulatory Visit (INDEPENDENT_AMBULATORY_CARE_PROVIDER_SITE_OTHER): Payer: No Typology Code available for payment source | Admitting: Family Medicine

## 2018-08-28 ENCOUNTER — Encounter: Payer: Self-pay | Admitting: Family Medicine

## 2018-08-28 VITALS — BP 110/72 | HR 101 | Temp 97.3°F | Ht 64.0 in | Wt 182.2 lb

## 2018-08-28 DIAGNOSIS — Z0000 Encounter for general adult medical examination without abnormal findings: Secondary | ICD-10-CM | POA: Diagnosis not present

## 2018-08-28 DIAGNOSIS — G43809 Other migraine, not intractable, without status migrainosus: Secondary | ICD-10-CM

## 2018-08-28 DIAGNOSIS — R1032 Left lower quadrant pain: Secondary | ICD-10-CM | POA: Diagnosis not present

## 2018-08-28 MED ORDER — PROPRANOLOL HCL 10 MG PO TABS: 10.0000 mg | ORAL_TABLET | Freq: Two times a day (BID) | ORAL | 1 refills | Status: DC

## 2018-08-28 MED ORDER — SUMATRIPTAN SUCCINATE 25 MG PO TABS: 25.0000 mg | ORAL_TABLET | ORAL | 0 refills | Status: DC | PRN

## 2018-08-28 MED FILL — SUMATRIPTAN SUCC 25 MG TAB: 25 | 17 days supply | Qty: 10 | Fill #0

## 2018-08-28 MED FILL — PROPRANOLOL 10 MG TABLET: 10 | 30 days supply | Qty: 60 | Fill #0

## 2018-08-28 NOTE — Progress Notes (Signed)
Subjective:     Jacqueline Sims is a 28 y.o. female and is here for a comprehensive physical exam. The patient reports problems - HAs, abdominal pain.  Pt with increased HAs since April/May.  Was on imitrex in [place].  May have 3-4 HAs per wk lasting 1-2 days.  Pain in frontal area and behind eyes.  Endorses sensitivity to light and sound.  Denies aura, n/v.  Drinking 1 L water per day.  Getting at least 8 hrs of sleep.  May have coffee each morning.  Will take Excedrin.  Ibuprofen does not work.  Pt to have ex lap on Sept 3rd to hopefully dx endometriosis.  Having ongoing LLQ pain.  Denies constipation.  Pap done 2018 in [place].  Had abnormal pap   Social History   Socioeconomic History  . Marital status: Single    Spouse name: Not on file  . Number of children: Not on file  . Years of education: Not on file  . Highest education level: Not on file  Occupational History  . Not on file  Social Needs  . Financial resource strain: Not on file  . Food insecurity    Worry: Not on file    Inability: Not on file  . Transportation needs    Medical: Not on file    Non-medical: Not on file  Tobacco Use  . Smoking status: Never Smoker  . Smokeless tobacco: Never Used  Substance and Sexual Activity  . Alcohol use: Yes    Comment: occassionally  . Drug use: Never  . Sexual activity: Not on file  Lifestyle  . Physical activity    Days per week: Not on file    Minutes per session: Not on file  . Stress: Not on file  Relationships  . Social Herbalist on phone: Not on file    Gets together: Not on file    Attends religious service: Not on file    Active member of club or organization: Not on file    Attends meetings of clubs or organizations: Not on file    Relationship status: Not on file  . Intimate partner violence    Fear of current or ex partner: Not on file    Emotionally abused: Not on file    Physically abused: Not on file    Forced sexual activity: Not on file   Other Topics Concern  . Not on file  Social History Narrative  . Not on file   Health Maintenance  Topic Date Due  . TETANUS/TDAP  06/12/2010  . PAP-Cervical Cytology Screening  06/11/2012  . PAP SMEAR-Modifier  06/11/2012  . INFLUENZA VACCINE  08/17/2018  . HIV Screening  Completed    The following portions of the patient's history were reviewed and updated as appropriate: allergies, current medications, past family history, past medical history, past social history, past surgical history and problem list.  Review of Systems Pertinent items noted in HPI and remainder of comprehensive ROS otherwise negative.   Objective:    BP 110/72   Pulse (!) 101   Temp (!) 97.3 F (36.3 C) (Oral)   Ht 5\' 4"  (1.626 m)   Wt 182 lb 3.2 oz (82.6 kg)   LMP 08/21/2018   SpO2 98%   BMI 31.27 kg/m     Assessment:    Healthy female exam with increased HAs.     Plan:     Anticipatory guidance given including wearing seatbelts, smoke detectors in  the home, increasing physical activity, increasing p.o. intake of water and vegetables. -pap due 2021 -labs up to date -given handout -Next CPE in 1 year See After Visit Summary for Counseling Recommendations    Migraines -Discussed headache prevention -We will start low-dose propranolol with plans to titrate up for headache prevention if bp and pulse tolerate. -Continue Imitrex as needed -Imitrex refilled. -Follow-up in 1 month  LLQ pain -likely 2/2 suspected endometriosis -Continue follow-up with OB/GYN for ex lap on September 3 -Given precautions  Follow-up PRN Grier Mitts, MD

## 2018-08-28 NOTE — H&P (Signed)
Jacqueline Sims is an 28 y.o. female. Presenting for scheduled [place] LSC and possible endometriosis removal. She has failed 3 different OCPs, has a strong FH of endometriosis, and continues to have heavy, painful periods. She desires confirmation with surgery.   Pertinent Gynecological History: Menses: flow is excessive with use of 12 pads or tampons on heaviest days Bleeding: dysfunctional uterine bleeding Contraception: OCP (estrogen/progesterone) DES exposure: denies Blood transfusions: none Sexually transmitted diseases: no past history Previous GYN Procedures: N/A  Last mammogram: n/a Date: n/a Last pap: normal Date: 2018 NIL OB History: G0   Menstrual History: Patient's last menstrual period was 08/21/2018.    Past Medical History:  Diagnosis Date  . Arthritis   . Asthma   . Chronic low back pain 06/19/2018  . GERD (gastroesophageal reflux disease)   . Hay fever   IBS   Past Surgical History:  Procedure Laterality Date  . FLAT FOOT CORRECTION Right 2015    Family History  Problem Relation Age of Onset  . Hypothyroidism Mother   . Multiple sclerosis Other   . Heart disease Maternal Grandmother   . Heart disease Maternal Grandfather     Social History:  reports that she has never smoked. She has never used smokeless tobacco. She reports current alcohol use. She reports that she does not use drugs.  Allergies:  Allergies  Allergen Reactions  . Lactose Nausea And Vomiting    No medications prior to admission.    ROS  Last menstrual period 08/21/2018. Physical Exam  Gen: well appearing, NAD CV: Reg rate Pulm: NWOB Abd: soft, nondistended, nontender, no mass es GYN: uterus 6  week size, no adnexa ttp in left, no CMT. Mild TTP in R adnexa Ext: No edema b/l  No results found for this or any previous visit (from the past 24 hour(s)).  No results found.  TVUS with normal uterus and L follicular cyst. FF in culdesac  Assessment/Plan: 28 yo w/h/o  dysmenorrhea and heavy menstrual cycles presenting for surgical confirmation of possible endometriosis. She has been counseled in the office regarding alternatives and desires surgery. Plan for [facility] possible endometriosis fulguration, possible biopsies, possible LOA, and  possible removal of endometriosis implants. Risks discussed including infection, bleeding, damage to surrounding structures, need for additional procedures, postoperative DVT, failure to confirm anything, scarring from surgery, postoperative pain, and inability to remove all or any lesions. All questions answered. Consent signed in the office.  After careful discussion, elects to continue her OCP in the perioperative period as benefits outweigh risks.    Tyson Dense 08/28/2018, 12:49 PM

## 2018-08-28 NOTE — Patient Instructions (Signed)
Preventive Care 20-27 Years Old, Female Preventive care refers to visits with your health care provider and lifestyle choices that can promote health and wellness. This includes:  A yearly physical exam. This may also be called an annual well check.  Regular dental visits and eye exams.  Immunizations.  Screening for certain conditions.  Healthy lifestyle choices, such as eating a healthy diet, getting regular exercise, not using drugs or products that contain nicotine and tobacco, and limiting alcohol use. What can I expect for my preventive care visit? Physical exam Your health care provider will check your:  Height and weight. This may be used to calculate body mass index (BMI), which tells if you are at a healthy weight.  Heart rate and blood pressure.  Skin for abnormal spots. Counseling Your health care provider may ask you questions about your:  Alcohol, tobacco, and drug use.  Emotional well-being.  Home and relationship well-being.  Sexual activity.  Eating habits.  Work and work Statistician.  Method of birth control.  Menstrual cycle.  Pregnancy history. What immunizations do I need?  Influenza (flu) vaccine  This is recommended every year. Tetanus, diphtheria, and pertussis (Tdap) vaccine  You may need a Td booster every 10 years. Varicella (chickenpox) vaccine  You may need this if you have not been vaccinated. Human papillomavirus (HPV) vaccine  If recommended by your health care provider, you may need three doses over 6 months. Measles, mumps, and rubella (MMR) vaccine  You may need at least one dose of MMR. You may also need a second dose. Meningococcal conjugate (MenACWY) vaccine  One dose is recommended if you are age 75-21 years and a first-year college student living in a residence hall, or if you have one of several medical conditions. You may also need additional booster doses. Pneumococcal conjugate (PCV13) vaccine  You may need  this if you have certain conditions and were not previously vaccinated. Pneumococcal polysaccharide (PPSV23) vaccine  You may need one or two doses if you smoke cigarettes or if you have certain conditions. Hepatitis A vaccine  You may need this if you have certain conditions or if you travel or work in places where you may be exposed to hepatitis A. Hepatitis B vaccine  You may need this if you have certain conditions or if you travel or work in places where you may be exposed to hepatitis B. Haemophilus influenzae type b (Hib) vaccine  You may need this if you have certain conditions. You may receive vaccines as individual doses or as more than one vaccine together in one shot (combination vaccines). Talk with your health care provider about the risks and benefits of combination vaccines. What tests do I need?  Blood tests  Lipid and cholesterol levels. These may be checked every 5 years starting at age 33.  Hepatitis C test.  Hepatitis B test. Screening  Diabetes screening. This is done by checking your blood sugar (glucose) after you have not eaten for a while (fasting).  Sexually transmitted disease (STD) testing.  BRCA-related cancer screening. This may be done if you have a family history of breast, ovarian, tubal, or peritoneal cancers.  Pelvic exam and Pap test. This may be done every 3 years starting at age 76. Starting at age 102, this may be done every 5 years if you have a Pap test in combination with an HPV test. Talk with your health care provider about your test results, treatment options, and if necessary, the need for more tests.  Follow these instructions at home: Eating and drinking   Eat a diet that includes fresh fruits and vegetables, whole grains, lean protein, and low-fat dairy.  Take vitamin and mineral supplements as recommended by your health care provider.  Do not drink alcohol if: ? Your health care provider tells you not to drink. ? You are  pregnant, may be pregnant, or are planning to become pregnant.  If you drink alcohol: ? Limit how much you have to 0-1 drink a day. ? Be aware of how much alcohol is in your drink. In the [place] one drink equals one 12 oz bottle of beer (355 mL), one 5 oz glass of wine (148 mL), or one 1 oz glass of hard liquor (44 mL). Lifestyle  Take daily care of your teeth and gums.  Stay active. Exercise for at least 30 minutes on 5 or more days each week.  Do not use any products that contain nicotine or tobacco, such as cigarettes, e-cigarettes, and chewing tobacco. If you need help quitting, ask your health care provider.  If you are sexually active, practice safe sex. Use a condom or other form of birth control (contraception) in order to prevent pregnancy and STIs (sexually transmitted infections). If you plan to become pregnant, see your health care provider for a preconception visit. What's next?  Visit your health care provider once a year for a well check visit.  Ask your health care provider how often you should have your eyes and teeth checked.  Stay up to date on all vaccines. This information is not intended to replace advice given to you by your health care provider. Make sure you discuss any questions you have with your health care provider. Document Released: 02/28/2001 Document Revised: 09/13/2017 Document Reviewed: 09/13/2017 Elsevier Patient Education  [place].  Migraine Headache A migraine headache is an intense, throbbing pain on one side or both sides of the head. Migraine headaches may also cause other symptoms, such as nausea, vomiting, and sensitivity to light and noise. A migraine headache can last from 4 hours to 3 days. Talk with your doctor about what things may bring on (trigger) your migraine headaches. What are the causes? The exact cause of this condition is not known. However, a migraine may be caused when nerves in the brain become irritated and release  chemicals that cause inflammation of blood vessels. This inflammation causes pain. This condition may be triggered or caused by:  Drinking alcohol.  Smoking.  Taking medicines, such as: ? Medicine used to treat chest pain (nitroglycerin). ? Birth control pills. ? Estrogen. ? Certain blood pressure medicines.  Eating or drinking products that contain nitrates, glutamate, aspartame, or tyramine. Aged cheeses, chocolate, or caffeine may also be triggers.  Doing physical activity. Other things that may trigger a migraine headache include:  Menstruation.  Pregnancy.  Hunger.  Stress.  Lack of sleep or too much sleep.  Weather changes.  Fatigue. What increases the risk? The following factors may make you more likely to experience migraine headaches:  Being a certain age. This condition is more common in people who are 58-41 years old.  Being female.  Having a family history of migraine headaches.  Being Caucasian.  Having a mental health condition, such as depression or anxiety.  Being obese. What are the signs or symptoms? The main symptom of this condition is pulsating or throbbing pain. This pain may:  Happen in any area of the head, such as on one side or  both sides.  Interfere with daily activities.  Get worse with physical activity.  Get worse with exposure to bright lights or loud noises. Other symptoms may include:  Nausea.  Vomiting.  Dizziness.  General sensitivity to bright lights, loud noises, or smells. Before you get a migraine headache, you may get warning signs (an aura). An aura may include:  Seeing flashing lights or having blind spots.  Seeing bright spots, halos, or zigzag lines.  Having tunnel vision or blurred vision.  Having numbness or a tingling feeling.  Having trouble talking.  Having muscle weakness. Some people have symptoms after a migraine headache (postdromal phase), such as:  Feeling tired.  Difficulty  concentrating. How is this diagnosed? A migraine headache can be diagnosed based on:  Your symptoms.  A physical exam.  Tests, such as: ? CT scan or an MRI of the head. These imaging tests can help rule out other causes of headaches. ? Taking fluid from the spine (lumbar puncture) and analyzing it (cerebrospinal fluid analysis, or CSF analysis). How is this treated? This condition may be treated with medicines that:  Relieve pain.  Relieve nausea.  Prevent migraine headaches. Treatment for this condition may also include:  Acupuncture.  Lifestyle changes like avoiding foods that trigger migraine headaches.  Biofeedback.  Cognitive behavioral therapy. Follow these instructions at home: Medicines  Take over-the-counter and prescription medicines only as told by your health care provider.  Ask your health care provider if the medicine prescribed to you: ? Requires you to avoid driving or using heavy machinery. ? Can cause constipation. You may need to take these actions to prevent or treat constipation:  Drink enough fluid to keep your urine pale yellow.  Take over-the-counter or prescription medicines.  Eat foods that are high in fiber, such as beans, whole grains, and fresh fruits and vegetables.  Limit foods that are high in fat and processed sugars, such as fried or sweet foods. Lifestyle  Do not drink alcohol.  Do not use any products that contain nicotine or tobacco, such as cigarettes, e-cigarettes, and chewing tobacco. If you need help quitting, ask your health care provider.  Get at least 8 hours of sleep every night.  Find ways to manage stress, such as meditation, deep breathing, or yoga. General instructions      Keep a journal to find out what may trigger your migraine headaches. For example, write down: ? What you eat and drink. ? How much sleep you get. ? Any change to your diet or medicines.  If you have a migraine headache: ? Avoid things  that make your symptoms worse, such as bright lights. ? It may help to lie down in a dark, quiet room. ? Do not drive or use heavy machinery. ? Ask your health care provider what activities are safe for you while you are experiencing symptoms.  Keep all follow-up visits as told by your health care provider. This is important. Contact a health care provider if:  You develop symptoms that are different or more severe than your usual migraine headache symptoms.  You have more than 15 headache days in one month. Get help right away if:  Your migraine headache becomes severe.  Your migraine headache lasts longer than 72 hours.  You have a fever.  You have a stiff neck.  You have vision loss.  Your muscles feel weak or like you cannot control them.  You start to lose your balance often.  You have trouble walking.  You faint.  You have a seizure. Summary  A migraine headache is an intense, throbbing pain on one side or both sides of the head. Migraines may also cause other symptoms, such as nausea, vomiting, and sensitivity to light and noise.  This condition may be treated with medicines and lifestyle changes. You may also need to avoid certain things that trigger a migraine headache.  Keep a journal to find out what may trigger your migraine headaches.  Contact your health care provider if you have more than 15 headache days in a month or you develop symptoms that are different or more severe than your usual migraine headache symptoms. This information is not intended to replace advice given to you by your health care provider. Make sure you discuss any questions you have with your health care provider. Document Released: 01/02/2005 Document Revised: 04/26/2018 Document Reviewed: 02/14/2018 Elsevier Patient Education  2020 [employer].

## 2018-09-03 MED FILL — FLUoxetine HCL 20 MG CAPS: 20 | 30 days supply | Qty: 30 | Fill #2

## 2018-09-16 ENCOUNTER — Other Ambulatory Visit (HOSPITAL_COMMUNITY)
Admission: RE | Admit: 2018-09-16 | Discharge: 2018-09-16 | Disposition: A | Discharge status: Home / Self Care | Payer: No Typology Code available for payment source | Source: Ambulatory Visit | Attending: Obstetrics and Gynecology | Admitting: Obstetrics and Gynecology

## 2018-09-16 DIAGNOSIS — N946 Dysmenorrhea, unspecified: Secondary | ICD-10-CM | POA: Diagnosis not present

## 2018-09-16 DIAGNOSIS — R102 Pelvic and perineal pain: Secondary | ICD-10-CM | POA: Diagnosis not present

## 2018-09-16 DIAGNOSIS — Z01812 Encounter for preprocedural laboratory examination: Secondary | ICD-10-CM | POA: Diagnosis present

## 2018-09-16 DIAGNOSIS — Z20828 Contact with and (suspected) exposure to other viral communicable diseases: Secondary | ICD-10-CM | POA: Diagnosis not present

## 2018-09-17 ENCOUNTER — Encounter (HOSPITAL_BASED_OUTPATIENT_CLINIC_OR_DEPARTMENT_OTHER): Payer: Self-pay

## 2018-09-17 ENCOUNTER — Other Ambulatory Visit: Payer: Self-pay

## 2018-09-17 LAB — SARS CORONAVIRUS 2 (TAT 6-24 HRS): SARS Coronavirus 2: NEGATIVE

## 2018-09-17 NOTE — Progress Notes (Signed)
SPOKE W/  Thomasene     SCREENING SYMPTOMS OF COVID 19:   COUGH--NO  RUNNY NOSE--- NO  SORE THROAT---NO  NASAL CONGESTION----NO  SNEEZING----NO  SHORTNESS OF BREATH---NO  DIFFICULTY BREATHING---NO TEMP >100.0 -----NO  UNEXPLAINED BODY ACHES------NO  CHILLS -------- NO  HEADACHES ---------NO  LOSS OF SMELL/ TASTE --------NO    HAVE YOU OR ANY FAMILY MEMBER TRAVELLED PAST 14 DAYS OUT OF THE   COUNTY---NO STATE----NO COUNTRY----NO  HAVE YOU OR ANY FAMILY MEMBER BEEN EXPOSED TO ANYONE WITH COVID 19? NO    

## 2018-09-17 NOTE — Progress Notes (Signed)
PCP - Dr. Grier Mitts last office visit note 08/28/2018 in epic Cardiologist - N/A  Chest x-ray - N/A EKG - N/A Stress Test - N/A ECHO - N/A Cardiac Cath - N/A  Sleep Study - N/A CPAP N/A-   Fasting Blood Sugar N/A-  Checks Blood Sugar _N/A____ times a day  Blood Thinner Instructions:N/A Aspirin Instructions:N/A Last Dose:N/A  Anesthesia review: N/A  Patient denies shortness of breath, fever, cough and chest pain at PAT appointment   Patient verbalized understanding of instructions that were given to them at the PAT appointment. Patient was also instructed that they will need to review over the PAT instructions again at home before surgery.

## 2018-09-17 NOTE — Progress Notes (Signed)
Spoke with: Lalla NPO:  After Midnight, no gum, candy, or mints   Arrival time: 0530AM Labs: Istat 8, UPT AM medications: Propanolol, Omeprazole Pre op orders: Yes Ride home: Linwood Dibbles (mom) [phone]

## 2018-09-18 NOTE — Anesthesia Preprocedure Evaluation (Addendum)
Anesthesia Evaluation  Patient identified by MRN, date of birth, ID band Patient awake    Reviewed: Allergy & Precautions, NPO status , Patient's Chart, lab work & pertinent test results  Airway Mallampati: II  TM Distance: <3 FB Neck ROM: Full    Dental  (+) Teeth Intact, Dental Advisory Given   Pulmonary asthma ,    Pulmonary exam normal breath sounds clear to auscultation       Cardiovascular negative cardio ROS Normal cardiovascular exam Rhythm:Regular Rate:Normal     Neuro/Psych  Headaches, PSYCHIATRIC DISORDERS Anxiety Depression    GI/Hepatic Neg liver ROS, GERD  Medicated and Controlled,  Endo/Other  negative endocrine ROSObesity   Renal/GU negative Renal ROS     Musculoskeletal  (+) Arthritis ,   Abdominal   Peds  Hematology negative hematology ROS (+)   Anesthesia Other Findings Day of surgery medications reviewed with the patient.  Reproductive/Obstetrics DYSMENORRHEA, PELVIC PAIN                           Anesthesia Physical Anesthesia Plan  ASA: II  Anesthesia Plan: General   Post-op Pain Management:    Induction: Intravenous  PONV Risk Score and Plan: 3 and Midazolam, Dexamethasone, Ondansetron and Scopolamine patch - Pre-op  Airway Management Planned: Oral ETT  Additional Equipment:   Intra-op Plan:   Post-operative Plan: Extubation in OR  Informed Consent: I have reviewed the patients History and Physical, chart, labs and discussed the procedure including the risks, benefits and alternatives for the proposed anesthesia with the patient or authorized representative who has indicated his/her understanding and acceptance.     Dental advisory given  Plan Discussed with: CRNA  Anesthesia Plan Comments:        Anesthesia Quick Evaluation

## 2018-09-19 ENCOUNTER — Encounter (HOSPITAL_BASED_OUTPATIENT_CLINIC_OR_DEPARTMENT_OTHER): Payer: Self-pay | Admitting: *Deleted

## 2018-09-19 ENCOUNTER — Ambulatory Visit (HOSPITAL_BASED_OUTPATIENT_CLINIC_OR_DEPARTMENT_OTHER): Payer: No Typology Code available for payment source | Admitting: Anesthesiology

## 2018-09-19 ENCOUNTER — Encounter (HOSPITAL_BASED_OUTPATIENT_CLINIC_OR_DEPARTMENT_OTHER)
Admission: RE | Disposition: A | Discharge status: Home / Self Care | Payer: Self-pay | Source: Home / Self Care | Attending: Obstetrics and Gynecology

## 2018-09-19 ENCOUNTER — Ambulatory Visit (HOSPITAL_BASED_OUTPATIENT_CLINIC_OR_DEPARTMENT_OTHER)
Admission: RE | Admit: 2018-09-19 | Discharge: 2018-09-19 | Disposition: A | Discharge status: Home / Self Care | Payer: No Typology Code available for payment source | Attending: Obstetrics and Gynecology | Admitting: Obstetrics and Gynecology

## 2018-09-19 ENCOUNTER — Other Ambulatory Visit: Payer: Self-pay

## 2018-09-19 DIAGNOSIS — N809 Endometriosis, unspecified: Secondary | ICD-10-CM | POA: Diagnosis present

## 2018-09-19 DIAGNOSIS — M1990 Unspecified osteoarthritis, unspecified site: Secondary | ICD-10-CM | POA: Diagnosis not present

## 2018-09-19 DIAGNOSIS — F419 Anxiety disorder, unspecified: Secondary | ICD-10-CM | POA: Diagnosis not present

## 2018-09-19 DIAGNOSIS — N801 Endometriosis of ovary: Secondary | ICD-10-CM | POA: Insufficient documentation

## 2018-09-19 DIAGNOSIS — R51 Headache: Secondary | ICD-10-CM | POA: Diagnosis not present

## 2018-09-19 DIAGNOSIS — E739 Lactose intolerance, unspecified: Secondary | ICD-10-CM | POA: Insufficient documentation

## 2018-09-19 DIAGNOSIS — Z8349 Family history of other endocrine, nutritional and metabolic diseases: Secondary | ICD-10-CM | POA: Diagnosis not present

## 2018-09-19 DIAGNOSIS — N803 Endometriosis of pelvic peritoneum: Secondary | ICD-10-CM | POA: Insufficient documentation

## 2018-09-19 DIAGNOSIS — G8929 Other chronic pain: Secondary | ICD-10-CM | POA: Insufficient documentation

## 2018-09-19 DIAGNOSIS — K219 Gastro-esophageal reflux disease without esophagitis: Secondary | ICD-10-CM | POA: Diagnosis not present

## 2018-09-19 DIAGNOSIS — Z820 Family history of epilepsy and other diseases of the nervous system: Secondary | ICD-10-CM | POA: Insufficient documentation

## 2018-09-19 DIAGNOSIS — N946 Dysmenorrhea, unspecified: Secondary | ICD-10-CM | POA: Insufficient documentation

## 2018-09-19 DIAGNOSIS — M545 Low back pain: Secondary | ICD-10-CM | POA: Diagnosis not present

## 2018-09-19 DIAGNOSIS — K589 Irritable bowel syndrome without diarrhea: Secondary | ICD-10-CM | POA: Diagnosis not present

## 2018-09-19 DIAGNOSIS — F329 Major depressive disorder, single episode, unspecified: Secondary | ICD-10-CM | POA: Insufficient documentation

## 2018-09-19 DIAGNOSIS — Z8249 Family history of ischemic heart disease and other diseases of the circulatory system: Secondary | ICD-10-CM | POA: Diagnosis not present

## 2018-09-19 DIAGNOSIS — J45909 Unspecified asthma, uncomplicated: Secondary | ICD-10-CM | POA: Insufficient documentation

## 2018-09-19 HISTORY — DX: Anxiety disorder, unspecified: F41.9

## 2018-09-19 HISTORY — DX: Polyneuropathy, unspecified: G62.9

## 2018-09-19 HISTORY — PX: LAPAROSCOPY: SHX197

## 2018-09-19 HISTORY — DX: Iron deficiency anemia, unspecified: D50.9

## 2018-09-19 HISTORY — DX: Vitamin D deficiency, unspecified: E55.9

## 2018-09-19 HISTORY — DX: Migraine, unspecified, not intractable, without status migrainosus: G43.909

## 2018-09-19 HISTORY — DX: Depression, unspecified: F32.A

## 2018-09-19 LAB — TYPE AND SCREEN
ABO/RH(D): A POS
Antibody Screen: NEGATIVE

## 2018-09-19 LAB — POCT I-STAT, CHEM 8
BUN: 10 mg/dL (ref 6–20)
Calcium, Ion: 1.26 mmol/L (ref 1.15–1.40)
Chloride: 108 mmol/L (ref 98–111)
Creatinine, Ser: 0.7 mg/dL (ref 0.44–1.00)
Glucose, Bld: 102 mg/dL — ABNORMAL HIGH (ref 70–99)
HCT: 37 % (ref 36.0–46.0)
Hemoglobin: 12.6 g/dL (ref 12.0–15.0)
Potassium: 3.9 mmol/L (ref 3.5–5.1)
Sodium: 139 mmol/L (ref 135–145)
TCO2: 19 mmol/L — ABNORMAL LOW (ref 22–32)

## 2018-09-19 LAB — ABO/RH: ABO/RH(D): A POS

## 2018-09-19 LAB — POCT PREGNANCY, URINE: Preg Test, Ur: NEGATIVE

## 2018-09-19 SURGERY — LAPAROSCOPY, DIAGNOSTIC
Anesthesia: General | Site: Abdomen

## 2018-09-19 MED ORDER — FENTANYL CITRATE (PF) 100 MCG/2ML IJ SOLN
25.0000 ug | INTRAMUSCULAR | Status: DC | PRN
  Administered 2018-09-19: 25 ug via INTRAVENOUS
  Filled 2018-09-19: qty 1

## 2018-09-19 MED ORDER — FENTANYL CITRATE (PF) 100 MCG/2ML IJ SOLN
INTRAMUSCULAR | Status: DC | PRN
  Administered 2018-09-19: 100 ug via INTRAVENOUS
  Administered 2018-09-19: 50 ug via INTRAVENOUS

## 2018-09-19 MED ORDER — ACETAMINOPHEN 500 MG PO TABS
1000.0000 mg | ORAL_TABLET | Freq: Once | ORAL | Status: AC
  Administered 2018-09-19: 1000 mg via ORAL
  Filled 2018-09-19: qty 2

## 2018-09-19 MED ORDER — ONDANSETRON HCL 4 MG/2ML IJ SOLN
INTRAMUSCULAR | Status: AC
  Filled 2018-09-19: qty 2

## 2018-09-19 MED ORDER — MIDAZOLAM HCL 2 MG/2ML IJ SOLN
INTRAMUSCULAR | Status: DC | PRN
  Administered 2018-09-19: 2 mg via INTRAVENOUS

## 2018-09-19 MED ORDER — CELECOXIB 200 MG PO CAPS
ORAL_CAPSULE | ORAL | Status: AC
  Filled 2018-09-19: qty 1

## 2018-09-19 MED ORDER — DEXAMETHASONE SODIUM PHOSPHATE 10 MG/ML IJ SOLN
INTRAMUSCULAR | Status: DC | PRN
  Administered 2018-09-19: 5 mg via INTRAVENOUS

## 2018-09-19 MED ORDER — BUPIVACAINE HCL (PF) 0.25 % IJ SOLN
INTRAMUSCULAR | Status: AC
  Filled 2018-09-19: qty 30

## 2018-09-19 MED ORDER — DEXAMETHASONE SODIUM PHOSPHATE 10 MG/ML IJ SOLN
INTRAMUSCULAR | Status: AC
  Filled 2018-09-19: qty 1

## 2018-09-19 MED ORDER — KETOROLAC TROMETHAMINE 30 MG/ML IJ SOLN
INTRAMUSCULAR | Status: AC
  Filled 2018-09-19: qty 1

## 2018-09-19 MED ORDER — FENTANYL CITRATE (PF) 100 MCG/2ML IJ SOLN
INTRAMUSCULAR | Status: AC
  Filled 2018-09-19: qty 2

## 2018-09-19 MED ORDER — 0.9 % SODIUM CHLORIDE (POUR BTL) OPTIME
TOPICAL | Status: DC | PRN
  Administered 2018-09-19: 500 mL

## 2018-09-19 MED ORDER — ACETAMINOPHEN 500 MG PO TABS
ORAL_TABLET | ORAL | Status: AC
  Filled 2018-09-19: qty 2

## 2018-09-19 MED ORDER — CELECOXIB 200 MG PO CAPS
200.0000 mg | ORAL_CAPSULE | Freq: Once | ORAL | Status: AC
  Administered 2018-09-19: 200 mg via ORAL
  Filled 2018-09-19: qty 1

## 2018-09-19 MED ORDER — LIDOCAINE 2% (20 MG/ML) 5 ML SYRINGE
INTRAMUSCULAR | Status: AC
  Filled 2018-09-19: qty 5

## 2018-09-19 MED ORDER — MIDAZOLAM HCL 2 MG/2ML IJ SOLN
INTRAMUSCULAR | Status: AC
  Filled 2018-09-19: qty 2

## 2018-09-19 MED ORDER — SUGAMMADEX SODIUM 200 MG/2ML IV SOLN
INTRAVENOUS | Status: DC | PRN
  Administered 2018-09-19: 200 mg via INTRAVENOUS

## 2018-09-19 MED ORDER — IBUPROFEN 600 MG PO TABS: 600.0000 mg | ORAL_TABLET | Freq: Four times a day (QID) | ORAL | 0 refills | Status: AC | PRN

## 2018-09-19 MED ORDER — PROPOFOL 10 MG/ML IV BOLUS
INTRAVENOUS | Status: DC | PRN
  Administered 2018-09-19: 150 mg via INTRAVENOUS
  Administered 2018-09-19: 50 mg via INTRAVENOUS

## 2018-09-19 MED ORDER — BUPIVACAINE HCL (PF) 0.25 % IJ SOLN
INTRAMUSCULAR | Status: DC | PRN
  Administered 2018-09-19: 10 mL

## 2018-09-19 MED ORDER — PROPOFOL 10 MG/ML IV BOLUS
INTRAVENOUS | Status: AC
  Filled 2018-09-19: qty 20

## 2018-09-19 MED ORDER — ONDANSETRON HCL 4 MG/2ML IJ SOLN
INTRAMUSCULAR | Status: DC | PRN
  Administered 2018-09-19: 4 mg via INTRAVENOUS

## 2018-09-19 MED ORDER — ROCURONIUM BROMIDE 10 MG/ML (PF) SYRINGE
PREFILLED_SYRINGE | INTRAVENOUS | Status: DC | PRN
  Administered 2018-09-19: 50 mg via INTRAVENOUS

## 2018-09-19 MED ORDER — SCOPOLAMINE 1 MG/3DAYS TD PT72
MEDICATED_PATCH | TRANSDERMAL | Status: AC
  Filled 2018-09-19: qty 1

## 2018-09-19 MED ORDER — SCOPOLAMINE 1 MG/3DAYS TD PT72
1.0000 | MEDICATED_PATCH | Freq: Once | TRANSDERMAL | Status: DC
  Administered 2018-09-19: 1.5 mg via TRANSDERMAL
  Filled 2018-09-19: qty 1

## 2018-09-19 MED ORDER — PROMETHAZINE HCL 25 MG/ML IJ SOLN
6.2500 mg | INTRAMUSCULAR | Status: DC | PRN
  Filled 2018-09-19: qty 1

## 2018-09-19 MED ORDER — OXYCODONE HCL 5 MG PO TABS: 5.0000 mg | ORAL_TABLET | Freq: Four times a day (QID) | ORAL | 0 refills | Status: DC | PRN

## 2018-09-19 MED ORDER — ARTIFICIAL TEARS OPHTHALMIC OINT
TOPICAL_OINTMENT | OPHTHALMIC | Status: AC
  Filled 2018-09-19: qty 3.5

## 2018-09-19 MED ORDER — LIDOCAINE 2% (20 MG/ML) 5 ML SYRINGE
INTRAMUSCULAR | Status: DC | PRN
  Administered 2018-09-19: 80 mg via INTRAVENOUS

## 2018-09-19 MED ORDER — LACTATED RINGERS IV SOLN
INTRAVENOUS | Status: DC
  Administered 2018-09-19 (×2): via INTRAVENOUS
  Filled 2018-09-19: qty 1000

## 2018-09-19 MED ORDER — ROCURONIUM BROMIDE 10 MG/ML (PF) SYRINGE
PREFILLED_SYRINGE | INTRAVENOUS | Status: AC
  Filled 2018-09-19: qty 10

## 2018-09-19 MED FILL — oxyCODONE HCL 5 MG TABS: 5 | 4 days supply | Qty: 15 | Fill #0

## 2018-09-19 MED FILL — IBUPROFEN 600 MG TABLET: 600 | 8 days supply | Qty: 30 | Fill #0

## 2018-09-19 SURGICAL SUPPLY — 36 items
BAG RETRIEVAL 10MM (BASKET)
CABLE HIGH FREQUENCY MONO STRZ (ELECTRODE) ×3 IMPLANT
CATH ROBINSON RED A/P 16FR (CATHETERS) ×3 IMPLANT
COVER MAYO STAND STRL (DRAPES) ×3 IMPLANT
COVER WAND RF STERILE (DRAPES) ×3 IMPLANT
DERMABOND ADVANCED (GAUZE/BANDAGES/DRESSINGS) ×2
DERMABOND ADVANCED .7 DNX12 (GAUZE/BANDAGES/DRESSINGS) ×1 IMPLANT
DRSG OPSITE POSTOP 3X4 (GAUZE/BANDAGES/DRESSINGS) IMPLANT
DURAPREP 26ML APPLICATOR (WOUND CARE) ×3 IMPLANT
GAUZE 4X4 16PLY RFD (DISPOSABLE) ×3 IMPLANT
GLOVE BIO SURGEON STRL SZ 6.5 (GLOVE) ×2 IMPLANT
GLOVE BIO SURGEONS STRL SZ 6.5 (GLOVE) ×1
GLOVE BIOGEL PI IND STRL 6.5 (GLOVE) ×1 IMPLANT
GLOVE BIOGEL PI INDICATOR 6.5 (GLOVE) ×2
GOWN STRL REUS W/TWL LRG LVL3 (GOWN DISPOSABLE) ×6 IMPLANT
IV NS 1000ML (IV SOLUTION)
IV NS 1000ML BAXH (IV SOLUTION) IMPLANT
LIGASURE LAP L-HOOKWIRE 5 44CM (INSTRUMENTS) IMPLANT
NEEDLE HYPO 25X1 1.5 SAFETY (NEEDLE) ×3 IMPLANT
NEEDLE INSUFFLATION 120MM (ENDOMECHANICALS) ×3 IMPLANT
NS IRRIG 500ML POUR BTL (IV SOLUTION) ×3 IMPLANT
PACK LAPAROSCOPY BASIN (CUSTOM PROCEDURE TRAY) ×3 IMPLANT
SCISSORS LAP 5X35 DISP (ENDOMECHANICALS) IMPLANT
SCISSORS LAP 5X45 EPIX DISP (ENDOMECHANICALS) IMPLANT
SET IRRIG TUBING LAPAROSCOPIC (IRRIGATION / IRRIGATOR) IMPLANT
SUT VICRYL 0 UR6 27IN ABS (SUTURE) ×3 IMPLANT
SUT VICRYL RAPIDE 4/0 PS 2 (SUTURE) ×6 IMPLANT
SYR 50ML LL SCALE MARK (SYRINGE) IMPLANT
SYS BAG RETRIEVAL 10MM (BASKET)
SYSTEM BAG RETRIEVAL 10MM (BASKET) IMPLANT
TOWEL OR 17X26 10 PK STRL BLUE (TOWEL DISPOSABLE) ×6 IMPLANT
TROCAR XCEL NON-BLD 11X100MML (ENDOMECHANICALS) ×3 IMPLANT
TROCAR XCEL NON-BLD 5MMX100MML (ENDOMECHANICALS) ×3 IMPLANT
TUBING EVAC SMOKE HEATED PNEUM (TUBING) ×3 IMPLANT
WARMER LAPAROSCOPE (MISCELLANEOUS) ×3 IMPLANT
WATER STERILE IRR 500ML POUR (IV SOLUTION) ×3 IMPLANT

## 2018-09-19 NOTE — Discharge Instructions (Signed)

## 2018-09-19 NOTE — Progress Notes (Signed)
No updates to above H&P. Patient arrived NPO and was consented in PACU. Risks again discussed, all questions answered, and consent signed. Proceed with above surgery.    Dhiren Azimi MD  

## 2018-09-19 NOTE — Transfer of Care (Signed)
Immediate Anesthesia Transfer of Care Note  Patient: Jacqueline Sims  Procedure(s) Performed: LAPAROSCOPY DIAGNOSTIC WITH FULGERATION OF ENDOMETRIOSIS (N/A Abdomen)  Patient Location: PACU  Anesthesia Type:General  Level of Consciousness: awake, alert , oriented and patient cooperative  Airway & Oxygen Therapy: Patient Spontanous Breathing and Patient connected to nasal cannula oxygen  Post-op Assessment: Report given to RN and Post -op Vital signs reviewed and stable  Post vital signs: Reviewed and stable  Last Vitals:  Vitals Value Taken Time  BP 119/61 09/19/18 0836  Temp    Pulse 116 09/19/18 0837  Resp 19 09/19/18 0837  SpO2 96 % 09/19/18 0837  Vitals shown include unvalidated device data.  Last Pain:  Vitals:   09/19/18 0555  TempSrc: Oral         Complications: No apparent anesthesia complications

## 2018-09-19 NOTE — Op Note (Signed)
DATE OF PROCEDURE: 09/19/18  PREOPERATIVE DIAGNOSIS: Endometriosis  POSTOPERATIVE DIAGNOSIS: Same - Stage 2 endometriosis  PROCEDURE PERFORMED: Diagnostic laparoscopy with fulguration of endometriosis  SURGEON: Lucillie Garfinkel, MD  ANESTHESIA: General  ESTIMATED BLOOD LOSS: 10cc.  URINE OUTPUT: See anesthesia record  COMPLICATIONS: None  TUBES: None.  DRAINS: None  PATHOLOGY: None  FINDINGS: On exam, under anesthesia, normal appearing vulva and vagina, a normal sized uterus.   Operative findings demonstrated a normal uterus with a small ~1cm posterior fibroid  Bilateral normal fallopian tubes. Right ovary WNL. Left ovary with two small endometriosis implants. Small implant overlying ureter on left side. No implants in culdesac or [place] ligaments. No obliteration of cul-de-sac. Implant in posterior cul-de-sac near colon The appendix was normal appearing. The bowel and omentum were normal appearing.  Procedure: A general anesthesia was induced and the patient was placed in the dirsal lithotomy position. The abdomen, perineum, and vagina were prepped and draped in the usual fashion. The bladder was drained. After the initial preparation, the procedure commenced at the vagina. With a speculum in place to visualize the cervix, the cervix was grasped and a jacobs tenaculum was placed within the uterine cavity for manipulation purposes being careful not to puncture the uterus. Attention was then turned to the abdomen.    An infraumbilical incision was made and the Veress needle was gently advanced taking care to feel for the typical sensation of penetrating the peritoneum. With CO2 infiltration, an opening pressure of 18mmHg was noted, and following this, a pneumoperitoneum of 15 mmHg was created. A 11 mm trocar was then passed through the same incision and the laparoscope was then inserted through the trocar sleeve.   Visualization of the peritoneal cavity was then obtained and a brief inspection  did not reveal any signs of complications from entry. Under direct observation, 14mm suprapubic port was placed taking care to respect anatomical landmarks and vessels. Once the placement of the port was complete, the actual laparoscopic procedure began. Above operative findings noted. Thorough inspection of abdomen.  Using monopolar marilyn's, the two superficial implants on the left ovary were fulgurated and destroyed taking care to not touch surrounding structures. The implant near colon in cul-de-sac was two close to colon for safe fulguration. The implant near the ureter was also too close.  All gas removed from abdomen and all instruments were removed from all places in the body. Fascia was closed with 0' vicryl and skin closed with 4'0 vicryl. The sponge and lap counts were correct times 2 at this time.   The patient's procedure was terminated. We then awakened her. She was sent to the Recovery Room in good condition.    Arty Baumgartner MD

## 2018-09-19 NOTE — Anesthesia Procedure Notes (Signed)
Procedure Name: Intubation Date/Time: 09/19/2018 7:33 AM Performed by: Wanita Chamberlain, CRNA Pre-anesthesia Checklist: Patient identified, Emergency Drugs available, Suction available and Patient being monitored Patient Re-evaluated:Patient Re-evaluated prior to induction Oxygen Delivery Method: Circle system utilized Preoxygenation: Pre-oxygenation with 100% oxygen Induction Type: IV induction Ventilation: Mask ventilation without difficulty Laryngoscope Size: Mac and 3 Grade View: Grade III Tube size: 7.0 mm Number of attempts: 2 Placement Confirmation: breath sounds checked- equal and bilateral,  CO2 detector,  positive ETCO2 and ETT inserted through vocal cords under direct vision Secured at: 22 cm Tube secured with: Tape Dental Injury: Teeth and Oropharynx as per pre-operative assessment  Difficulty Due To: Difficulty was unanticipated Comments: Mac #3 1 st attempt ETT to stomach immediately identified, (some difficulty elevating epiglottis).  2nd attempt thru v.cords VSS throughout [facility] CRNA

## 2018-09-19 NOTE — Anesthesia Postprocedure Evaluation (Signed)
Anesthesia Post Note  Patient: Jacqueline Sims  Procedure(s) Performed: LAPAROSCOPY DIAGNOSTIC WITH FULGERATION OF ENDOMETRIOSIS (N/A Abdomen)     Patient location during evaluation: PACU Anesthesia Type: General Level of consciousness: awake and alert, awake and oriented Pain management: pain level controlled Vital Signs Assessment: post-procedure vital signs reviewed and stable Respiratory status: spontaneous breathing, nonlabored ventilation and respiratory function stable Cardiovascular status: blood pressure returned to baseline and stable Postop Assessment: no apparent nausea or vomiting Anesthetic complications: no    Last Vitals:  Vitals:   09/19/18 0915 09/19/18 1008  BP: 125/74 116/74  Pulse: 97 90  Resp: 18 18  Temp:    SpO2: 96% 98%    Last Pain:  Vitals:   09/19/18 1008  TempSrc:   PainSc: 0-No pain                 Catalina Gravel

## 2018-09-20 ENCOUNTER — Encounter (HOSPITAL_BASED_OUTPATIENT_CLINIC_OR_DEPARTMENT_OTHER): Payer: Self-pay | Admitting: Obstetrics and Gynecology

## 2018-10-07 ENCOUNTER — Encounter: Payer: Self-pay | Admitting: Gynecology

## 2018-10-21 ENCOUNTER — Encounter (HOSPITAL_COMMUNITY): Payer: Self-pay

## 2018-10-21 ENCOUNTER — Ambulatory Visit (INDEPENDENT_AMBULATORY_CARE_PROVIDER_SITE_OTHER): Payer: No Typology Code available for payment source

## 2018-10-21 ENCOUNTER — Ambulatory Visit: Payer: Self-pay | Admitting: *Deleted

## 2018-10-21 ENCOUNTER — Ambulatory Visit (HOSPITAL_COMMUNITY)
Admission: EM | Admit: 2018-10-21 | Discharge: 2018-10-21 | Disposition: A | Discharge status: Home / Self Care | Payer: No Typology Code available for payment source

## 2018-10-21 ENCOUNTER — Other Ambulatory Visit: Payer: Self-pay

## 2018-10-21 DIAGNOSIS — S6991XA Unspecified injury of right wrist, hand and finger(s), initial encounter: Secondary | ICD-10-CM

## 2018-10-21 NOTE — Telephone Encounter (Signed)
Pt cut finger and stitches on 10/20/2018; she took the band aid off, and when she opens her finger it bleeds (right ring finger); she said that there is bleeding between the 1st 2 stitches; she says that was a steady stream, but she reapplied dressing and coban; now the site is oozing; recommendations made per nurse triage protocol; the pt would like to be seen in office; per LuWann there is no availability in the office; pt should proceed to UC; pt notified, and verbalized understanding.  Reason for Disposition . [1] SEVERE pain AND [2] not improved 2 hours after pain medicine/ice packs  Answer Assessment - Initial Assessment Questions 1. MECHANISM: "How did the injury happen?"      Cut right ring finger on blender blade 2. ONSET: "When did the injury happen?" (Minutes or hours ago)      09/20/2018 at 1130 3. LOCATION: "What part of the finger is injured?" "Is the nail damaged?"    Right ring finger 4. APPEARANCE of the INJURY: "What does the injury look like?"      Gap between sutures 5. SEVERITY: "Can you use the hand normally?"  "Can you bend your fingers into a ball and then fully open them?"  yes 6. SIZE: For cuts, bruises, or swelling, ask: "How large is it?" (e.g., inches or centimeters;  entire finger)   1/2 inch 7. PAIN: "Is there pain?" If so, ask: "How bad is the pain?"    (e.g., Scale 1-10; or mild, moderate, severe)     8 out of 10 8. TETANUS: For any breaks in the skin, ask: "When was the last tetanus booster?"     Yes,booster 2016 9. OTHER SYMPTOMS: "Do you have any other symptoms?"    no 10. PREGNANCY: "Is there any chance you are pregnant?" "When was your last menstrual period?"       No 10/15/2018  Protocols used: FINGER INJURY-A-AH

## 2018-10-21 NOTE — ED Provider Notes (Signed)
[facility]    CSN: [id number] Arrival date & time: 10/21/18  [id number]      History   Chief Complaint Chief Complaint  Patient presents with  . Hand Pain    HPI Jacqueline Sims is a 28 y.o. female.   Patient is a 28 year old female who presents today with right middle finger discomfort, numbness and cold sensation.  Reporting that she cut her finger yesterday on a blender at home and went to a local urgent care where she had stitches placed.  She is still since had some slow  bleeding to the finger.  She has been able to move the finger.  Reporting most the pain to the tip of her finger.  She has had Coban and wrapped tightly only finger.  No fever.   ROS per HPI    Hand Pain    Past Medical History:  Diagnosis Date  . Anxiety   . Arthritis   . Asthma   . Chronic low back pain 06/19/2018  . Depression   . GERD (gastroesophageal reflux disease)   . Hay fever   . Iron deficiency anemia   . Migraines   . Neuropathy    Right foot  . Vitamin D deficiency     Patient Active Problem List   Diagnosis Date Noted  . Chronic low back pain 06/19/2018  . Hyperreflexia 06/15/2018  . Ataxia   . Neurologic disorder   . Hyperreflexia of lower extremity 06/14/2018  . Mild asthma 06/14/2018  . Hypokalemia 06/14/2018  . Microcytic hypochromic anemia 06/14/2018  . Thrombocytosis ([place]) 06/14/2018  . Rash 06/14/2018  . Leukocytosis 06/14/2018  . Dysmenorrhea 06/03/2018  . Irritable bowel syndrome 06/03/2018  . Depression, recurrent ([place]) 01/27/2018  . Arthritis 01/27/2018  . Gastroesophageal reflux disease without esophagitis 01/27/2018  . Flat feet, bilateral 01/27/2018  . Rhinitis, allergic 04/17/2006    Past Surgical History:  Procedure Laterality Date  . FLAT FOOT CORRECTION Right 2015  . FOOT SURGERY Right   . LAPAROSCOPY N/A 09/19/2018   Procedure: LAPAROSCOPY DIAGNOSTIC WITH FULGERATION OF ENDOMETRIOSIS;  Surgeon: Tyson Dense, MD;  Location:  [facility];  Service: Gynecology;  Laterality: N/A;  . WISDOM TOOTH EXTRACTION      OB History   No obstetric history on file.      Home Medications    Prior to Admission medications   Medication Sig Start Date End Date Taking? Authorizing Provider  acetaminophen (TYLENOL) 500 MG tablet Take 1,000 mg by mouth every 6 (six) hours as needed.    [provider]  ALAYCEN 1/35 tablet  08/21/18   [provider]  albuterol (PROVENTIL HFA;VENTOLIN HFA) 108 (90 Base) MCG/ACT inhaler Inhale 2 puffs into the lungs every 6 (six) hours as needed for wheezing or shortness of breath. 04/11/18   Dutch Quint B, FNP  azelastine (ASTELIN) 0.1 % nasal spray Place 1 spray into both nostrils daily. Patient taking differently: Place 1 spray into both nostrils daily as needed for allergies.  03/08/18   Billie Ruddy, MD  ferrous sulfate 324 (65 Fe) MG TBEC Take 1 tablet (324 mg total) by mouth daily. 07/15/18   Billie Ruddy, MD  FLUoxetine (PROZAC) 20 MG capsule Take 1 capsule (20 mg total) by mouth daily. Resume when tremors and hyperreflexia have resolved. Patient taking differently: Take 20 mg by mouth every evening. Resume when tremors and hyperreflexia have resolved. 06/15/18   Mikhail, Velta Addison, DO  FLUoxetine (PROZAC) 20 MG  capsule Take by mouth.    [provider]  gabapentin (NEURONTIN) 100 MG capsule Take 200 mg by mouth as needed.    [provider]  ibuprofen (ADVIL) 600 MG tablet Take 1 tablet (600 mg total) by mouth every 6 (six) hours as needed. 09/19/18   Tyson Dense, MD  omeprazole (PRILOSEC) 20 MG capsule Take 20 mg by mouth every morning.    [provider]  oxyCODONE (OXY IR/ROXICODONE) 5 MG immediate release tablet Take 1 tablet (5 mg total) by mouth every 6 (six) hours as needed for severe pain. 09/19/18   Tyson Dense, MD  propranolol (INDERAL) 10 MG tablet Take 1 tablet (10 mg total) by mouth 2 (two) times  daily. 08/28/18   Billie Ruddy, MD  propranolol (INDERAL) 10 MG tablet Take by mouth.    [provider]  SUMAtriptan (IMITREX) 25 MG tablet Take 1 tablet (25 mg total) by mouth every 2 (two) hours as needed for migraine. May repeat in 2 hours if headache persists or recurs. 08/28/18   Billie Ruddy, MD  SUMAtriptan (IMITREX) 25 MG tablet Take by mouth.    [provider]  Vitamin D, Ergocalciferol, (DRISDOL) 1.25 MG (50000 UT) CAPS capsule Take 1 capsule (50,000 Units total) by mouth every 7 (seven) days. 07/15/18   Billie Ruddy, MD    Family History Family History  Problem Relation Age of Onset  . Hypothyroidism Mother   . Multiple sclerosis Other   . Heart disease Maternal Grandmother   . Heart disease Maternal Grandfather     Social History Social History   Tobacco Use  . Smoking status: Never Smoker  . Smokeless tobacco: Never Used  Substance Use Topics  . Alcohol use: Yes    Comment: rare  . Drug use: Never     Allergies   Lactose   Review of Systems Review of Systems   Physical Exam Triage Vital Signs ED Triage Vitals  Enc Vitals Group     BP 10/21/18 1013 121/87     Pulse Rate 10/21/18 1013 94     Resp 10/21/18 1013 17     Temp 10/21/18 1013 98 F (36.7 C)     Temp Source 10/21/18 1013 Tympanic     SpO2 10/21/18 1013 98 %     Weight 10/21/18 1021 180 lb (81.6 kg)     Height --      Head Circumference --      Peak Flow --      Pain Score 10/21/18 1021 8     Pain Loc --      Pain Edu? --      Excl. in [place]? --    No data found.  Updated Vital Signs BP 121/87 (BP Location: Left Arm)   Pulse 94   Temp 98 F (36.7 C) (Tympanic)   Resp 17   Wt 180 lb (81.6 kg)   LMP 10/14/2018 (Exact Date)   SpO2 98%   BMI 30.90 kg/m   Visual Acuity Right Eye Distance:   Left Eye Distance:   Bilateral Distance:    Right Eye Near:   Left Eye Near:    Bilateral Near:     Physical Exam Vitals signs and nursing note reviewed.   Constitutional:      General: She is not in acute distress.    Appearance: Normal appearance. She is not ill-appearing, toxic-appearing or diaphoretic.  HENT:     Head: Normocephalic and atraumatic.  Nose: Nose normal.  Eyes:     Conjunctiva/sclera: Conjunctivae normal.  Pulmonary:     Effort: Pulmonary effort is normal.  Skin:    General: Skin is warm and dry.     Comments: Laceration with 3 sutures No redness, swelling.  Clean and dry.  Color normal.  Good cap refill Temperature normal.  Good flexion and extension of the finger.   Neurological:     Mental Status: She is alert.  Psychiatric:        Mood and Affect: Mood normal.      UC Treatments / Results  Labs (all labs ordered are listed, but only abnormal results are displayed) Labs Reviewed - No data to display  EKG   Radiology Dg Hand Complete Right  Result Date: 10/21/2018 CLINICAL DATA:  Cut right ring finger on food process or blade. Wound at base of right fourth digit EXAM: RIGHT HAND - COMPLETE 3+ VIEW COMPARISON:  None. FINDINGS: There is no evidence of fracture or dislocation. There is no evidence of arthropathy or other focal bone abnormality. Soft tissues are unremarkable. No foreign body is demonstrated. IMPRESSION: Negative. Electronically Signed   By: Zetta Bills M.D.   On: 10/21/2018 11:12    Procedures Procedures (including critical care time)  Medications Ordered in UC Medications - No data to display  Initial Impression / Assessment and Plan / UC Course  I have reviewed the triage vital signs and the nursing notes.  Pertinent labs & imaging results that were available during my care of the patient were reviewed by me and considered in my medical decision making (see chart for details).     X ray normal Wound well sutured.  Steri strip placed over area with slow bleeding.  Placed Band-Aid.  Good cap refill and temperature and color normal. Flexion and extension normal.  No  concerns for tendon involvement.  Follow up as needed for continued or worsening symptoms  Final Clinical Impressions(s) / UC Diagnoses   Final diagnoses:  Injury of finger of right hand, initial encounter     Discharge Instructions     X ray was normal Placed steri strip on open area. This should help.  Follow up as needed for continued or worsening symptoms     ED Prescriptions    None     PDMP not reviewed this encounter.   Orvan July, NP 10/21/18 1214

## 2018-10-21 NOTE — Discharge Instructions (Addendum)
X ray was normal Placed steri strip on open area. This should help.  Follow up as needed for continued or worsening symptoms

## 2018-10-21 NOTE — ED Triage Notes (Signed)
Pt states she got stitches yesterday in her right hand middle finger. Pt states her finger is hurting more and feels cold. Pt state she got no x rays. Pt states she cut her finger on a bender at home.

## 2018-11-04 ENCOUNTER — Telehealth (INDEPENDENT_AMBULATORY_CARE_PROVIDER_SITE_OTHER): Payer: No Typology Code available for payment source | Admitting: Family Medicine

## 2018-11-04 ENCOUNTER — Other Ambulatory Visit: Payer: Self-pay

## 2018-11-04 ENCOUNTER — Other Ambulatory Visit: Payer: Self-pay | Admitting: Family Medicine

## 2018-11-04 DIAGNOSIS — N809 Endometriosis, unspecified: Secondary | ICD-10-CM

## 2018-11-04 DIAGNOSIS — E538 Deficiency of other specified B group vitamins: Secondary | ICD-10-CM

## 2018-11-04 DIAGNOSIS — E559 Vitamin D deficiency, unspecified: Secondary | ICD-10-CM

## 2018-11-04 DIAGNOSIS — R519 Headache, unspecified: Secondary | ICD-10-CM

## 2018-11-04 DIAGNOSIS — G8929 Other chronic pain: Secondary | ICD-10-CM

## 2018-11-04 DIAGNOSIS — Z3009 Encounter for other general counseling and advice on contraception: Secondary | ICD-10-CM

## 2018-11-04 DIAGNOSIS — Z8669 Personal history of other diseases of the nervous system and sense organs: Secondary | ICD-10-CM

## 2018-11-04 MED ORDER — ALYACEN 1/35 1-35 MG-MCG PO TABS: 1.0000 | ORAL_TABLET | Freq: Every day | ORAL | 3 refills | Status: DC

## 2018-11-04 MED ORDER — SUMATRIPTAN SUCCINATE 25 MG PO TABS: 25.0000 mg | ORAL_TABLET | ORAL | 3 refills | Status: DC | PRN

## 2018-11-04 MED FILL — SUMATRIPTAN SUCC 25 MG TAB: 25 | 17 days supply | Qty: 10 | Fill #0

## 2018-11-04 MED FILL — ALYACEN 1-35-28 TABLET: 1-35 | 28 days supply | Qty: 28 | Fill #0

## 2018-11-04 NOTE — Progress Notes (Signed)
Virtual Visit via Video Note  I connected with Jacqueline Sims on 11/04/18 at 11:00 AM EDT by a video enabled telemedicine application 2/2 [id number] pandemic and verified that I am speaking with the correct person using two identifiers.  Location patient: home Location provider:work or home office Persons participating in the virtual visit: patient, provider  I discussed the limitations of evaluation and management by telemedicine and the availability of in person appointments. The patient expressed understanding and agreed to proceed.   HPI: Pt seen for f/u.  Had diagnostic lap for endometriosis.  States there was endometriosis noted on a ureter that was not removed d/t its location.  Pt states she was having flank pain last month and wants labs to check her "kidney baseline".   Pt is no longer having flank pain.  Pt also wants vit D and B12 rechecked.  Pt also requesting refill on OCPs, unable to get in with OB/Gyn.  Pt states she is continuing to have HAs.  Propranolol 10 mg did not help and made her feel dizzy.  She is having HAs daily and a migraine every other week.  Daily HAs hurt behind eyes and forehead.  Migraines are in the same location, but more intense.  Taking allergy meds and azelastine without relief.  Also taking sudafed.  Request refill on imitrex.  Has not had recent f/u with Neurology.  Pt inquires about possibly switching to Amitriptyline from prozac.  Pt has an appt with her psychiatrist this wk.    Social hx: Pt is starting a new job with [employer] at Nov 9th.  Her insurance ends at the end of the month.     ROS: See pertinent positives and negatives per HPI.  Past Medical History:  Diagnosis Date  . Anxiety   . Arthritis   . Asthma   . Chronic low back pain 06/19/2018  . Depression   . GERD (gastroesophageal reflux disease)   . Hay fever   . Iron deficiency anemia   . Migraines   . Neuropathy    Right foot  . Vitamin D deficiency     Past Surgical History:   Procedure Laterality Date  . FLAT FOOT CORRECTION Right 2015  . FOOT SURGERY Right   . LAPAROSCOPY N/A 09/19/2018   Procedure: LAPAROSCOPY DIAGNOSTIC WITH FULGERATION OF ENDOMETRIOSIS;  Surgeon: Tyson Dense, MD;  Location: [facility];  Service: Gynecology;  Laterality: N/A;  . WISDOM TOOTH EXTRACTION      Family History  Problem Relation Age of Onset  . Hypothyroidism Mother   . Multiple sclerosis Other   . Heart disease Maternal Grandmother   . Heart disease Maternal Grandfather     Current Outpatient Medications:  .  acetaminophen (TYLENOL) 500 MG tablet, Take 1,000 mg by mouth every 6 (six) hours as needed., Disp: , Rfl:  .  ALAYCEN 1/35 tablet, , Disp: , Rfl:  .  albuterol (PROVENTIL HFA;VENTOLIN HFA) 108 (90 Base) MCG/ACT inhaler, Inhale 2 puffs into the lungs every 6 (six) hours as needed for wheezing or shortness of breath., Disp: 1 Inhaler, Rfl: 0 .  azelastine (ASTELIN) 0.1 % nasal spray, Place 1 spray into both nostrils daily. (Patient taking differently: Place 1 spray into both nostrils daily as needed for allergies. ), Disp: 30 mL, Rfl: 4 .  ferrous sulfate 324 (65 Fe) MG TBEC, Take 1 tablet (324 mg total) by mouth daily., Disp: 30 tablet, Rfl: 3 .  FLUoxetine (PROZAC) 20 MG capsule, Take 1 capsule (  20 mg total) by mouth daily. Resume when tremors and hyperreflexia have resolved. (Patient taking differently: Take 20 mg by mouth every evening. Resume when tremors and hyperreflexia have resolved.), Disp: , Rfl: 3 .  FLUoxetine (PROZAC) 20 MG capsule, Take by mouth., Disp: , Rfl:  .  gabapentin (NEURONTIN) 100 MG capsule, Take 200 mg by mouth as needed., Disp: , Rfl:  .  ibuprofen (ADVIL) 600 MG tablet, Take 1 tablet (600 mg total) by mouth every 6 (six) hours as needed., Disp: 30 tablet, Rfl: 0 .  omeprazole (PRILOSEC) 20 MG capsule, Take 20 mg by mouth every morning., Disp: , Rfl:  .  oxyCODONE (OXY IR/ROXICODONE) 5 MG immediate release tablet, Take 1  tablet (5 mg total) by mouth every 6 (six) hours as needed for severe pain., Disp: 15 tablet, Rfl: 0 .  propranolol (INDERAL) 10 MG tablet, Take 1 tablet (10 mg total) by mouth 2 (two) times daily., Disp: 60 tablet, Rfl: 1 .  propranolol (INDERAL) 10 MG tablet, Take by mouth., Disp: , Rfl:  .  SUMAtriptan (IMITREX) 25 MG tablet, Take 1 tablet (25 mg total) by mouth every 2 (two) hours as needed for migraine. May repeat in 2 hours if headache persists or recurs., Disp: 10 tablet, Rfl: 0 .  SUMAtriptan (IMITREX) 25 MG tablet, Take by mouth., Disp: , Rfl:  .  Vitamin D, Ergocalciferol, (DRISDOL) 1.25 MG (50000 UT) CAPS capsule, Take 1 capsule (50,000 Units total) by mouth every 7 (seven) days., Disp: 12 capsule, Rfl: 0  EXAM:  VITALS per patient if applicable: RR between 123456 bpm.  GENERAL: alert, oriented, appears well and in no acute distress  HEENT: atraumatic, conjunctiva clear, no obvious abnormalities on inspection of external nose and ears  NECK: normal movements of the head and neck  LUNGS: on inspection no signs of respiratory distress, breathing rate appears normal, no obvious gross SOB, gasping or wheezing  CV: no obvious cyanosis  MS: moves all visible extremities without noticeable abnormality  PSYCH/NEURO: pleasant and cooperative, no obvious depression or anxiety, speech and thought processing grossly intact  ASSESSMENT AND PLAN:  Discussed the following assessment and plan:  Chronic nonintractable headache, unspecified headache type -having daily HAs that are different from migraines -discussed HA prevention -will d/c propranolol 10 mg as caused dizziness.  Pt would likely not tolerate the dose increase for HA/migraine prevention. -advised to use tylenol/NSAIDs sparingly to prevent rebound HAs. -advised to f/u with neurology again.  Vitamin D deficiency  - Plan: Vitamin D, 25-hydroxy  Vitamin B12 deficiency  - Plan: Vitamin B12  Counseling for birth control,  oral contraceptives  - Plan: ALAYCEN 1/35 tablet  History of migraine  -discussed migraine prevention -MR brain 06/14/18 normal -advised to discuss d/c'ing prozac and starting Amitriptyline with Psychiatry.  Advised timing may not be the best as starting a new job. -encouraged to f/u with Neurology - Plan: SUMAtriptan (IMITREX) 25 MG tablet  Endometriosis -seen on diagnostic lap -continue OCPs -continue f/u with OB/Gyn  Pt advised renal function normal, creatinine 0.70 on last BMP 09/19/2018.  F/u prn   I discussed the assessment and treatment plan with the patient. The patient was provided an opportunity to ask questions and all were answered. The patient agreed with the plan and demonstrated an understanding of the instructions.   The patient was advised to call back or seek an in-person evaluation if the symptoms worsen or if the condition fails to improve as anticipated.   Billie Ruddy,  MD

## 2018-11-05 MED FILL — FLUoxetine HCL 20 MG CAPS: 20 | 30 days supply | Qty: 30 | Fill #3

## 2019-01-19 DIAGNOSIS — Z1159 Encounter for screening for other viral diseases: Secondary | ICD-10-CM | POA: Diagnosis not present

## 2019-01-19 DIAGNOSIS — Z20822 Contact with and (suspected) exposure to covid-19: Secondary | ICD-10-CM | POA: Diagnosis not present

## 2019-01-20 ENCOUNTER — Encounter

## 2019-01-20 ENCOUNTER — Ambulatory Visit: Payer: Self-pay | Admitting: Podiatry

## 2019-01-22 ENCOUNTER — Encounter: Payer: Self-pay | Admitting: Family Medicine

## 2019-01-22 ENCOUNTER — Other Ambulatory Visit: Payer: Self-pay

## 2019-01-22 DIAGNOSIS — R292 Abnormal reflex: Secondary | ICD-10-CM

## 2019-01-23 ENCOUNTER — Ambulatory Visit: Payer: Self-pay | Admitting: Podiatry

## 2019-01-28 DIAGNOSIS — Z20822 Contact with and (suspected) exposure to covid-19: Secondary | ICD-10-CM | POA: Diagnosis not present

## 2019-01-28 DIAGNOSIS — R0989 Other specified symptoms and signs involving the circulatory and respiratory systems: Secondary | ICD-10-CM | POA: Diagnosis not present

## 2019-01-31 DIAGNOSIS — R0989 Other specified symptoms and signs involving the circulatory and respiratory systems: Secondary | ICD-10-CM | POA: Diagnosis not present

## 2019-01-31 DIAGNOSIS — Z20822 Contact with and (suspected) exposure to covid-19: Secondary | ICD-10-CM | POA: Diagnosis not present

## 2019-02-06 ENCOUNTER — Ambulatory Visit (INDEPENDENT_AMBULATORY_CARE_PROVIDER_SITE_OTHER): Payer: Self-pay | Admitting: Podiatry

## 2019-02-06 DIAGNOSIS — M216X1 Other acquired deformities of right foot: Secondary | ICD-10-CM | POA: Diagnosis not present

## 2019-02-06 DIAGNOSIS — M25571 Pain in right ankle and joints of right foot: Secondary | ICD-10-CM | POA: Diagnosis not present

## 2019-02-06 DIAGNOSIS — Z5329 Procedure and treatment not carried out because of patient's decision for other reasons: Secondary | ICD-10-CM

## 2019-02-06 DIAGNOSIS — M79671 Pain in right foot: Secondary | ICD-10-CM | POA: Diagnosis not present

## 2019-02-06 DIAGNOSIS — M2141 Flat foot [pes planus] (acquired), right foot: Secondary | ICD-10-CM | POA: Diagnosis not present

## 2019-02-06 NOTE — Progress Notes (Signed)
No show for appt. 

## 2019-02-10 ENCOUNTER — Telehealth: Payer: Self-pay | Admitting: Neurology

## 2019-02-10 NOTE — Telephone Encounter (Signed)
Pt sees Dr. Jannifer Franklin and is being referred back to our office for Hyperreflexia. She has asked to switch her care from Dr. Jannifer Franklin to Dr. Jaynee Eagles. Please advise if the switch is ok.  Thank you

## 2019-02-10 NOTE — Telephone Encounter (Signed)
Patient was just seen by Dr. Jannifer Franklin within the last year. I don't think I have anything to offer her more than Dr. Jannifer Franklin, for continuity of care I think she should see Dr. Jannifer Franklin. thanks

## 2019-02-19 ENCOUNTER — Encounter: Payer: Self-pay | Admitting: Family Medicine

## 2019-02-26 NOTE — Telephone Encounter (Signed)
Patient called to follow up on her previous message sent on mychart.

## 2019-02-28 ENCOUNTER — Telehealth: Payer: No Typology Code available for payment source | Admitting: Family Medicine

## 2019-03-01 ENCOUNTER — Ambulatory Visit (INDEPENDENT_AMBULATORY_CARE_PROVIDER_SITE_OTHER)
Admission: RE | Admit: 2019-03-01 | Discharge: 2019-03-01 | Disposition: A | Discharge status: Home / Self Care | Payer: BC Managed Care – PPO | Source: Ambulatory Visit

## 2019-03-01 ENCOUNTER — Telehealth (HOSPITAL_COMMUNITY): Payer: Self-pay

## 2019-03-01 DIAGNOSIS — J329 Chronic sinusitis, unspecified: Secondary | ICD-10-CM

## 2019-03-01 DIAGNOSIS — J320 Chronic maxillary sinusitis: Secondary | ICD-10-CM

## 2019-03-01 MED ORDER — AMOXICILLIN-POT CLAVULANATE 875-125 MG PO TABS: 1.0000 | ORAL_TABLET | Freq: Two times a day (BID) | ORAL | 0 refills | Status: AC

## 2019-03-01 MED ORDER — FLUCONAZOLE 200 MG PO TABS: 200.0000 mg | ORAL_TABLET | Freq: Once | ORAL | 0 refills | Status: AC

## 2019-03-01 MED ORDER — AMOXICILLIN-POT CLAVULANATE 875-125 MG PO TABS: 1.0000 | ORAL_TABLET | Freq: Two times a day (BID) | ORAL | 0 refills | Status: DC

## 2019-03-01 NOTE — Telephone Encounter (Signed)
Medication switched to [facility] on [address] per pt preference due to power outage at primary pharmacy. Patient aware.

## 2019-03-01 NOTE — Discharge Instructions (Addendum)
Take antibiotic as prescribed. Follow up w/ PCP and/or ENT for persistent/worsening symptoms.

## 2019-03-01 NOTE — ED Provider Notes (Signed)
Virtual Visit via Video Note:  Jacqueline Sims  initiated request for Telemedicine visit with [facility] team. I connected with Jacqueline Sims  on 03/01/2019 at 10:40 AM  for a synchronized telemedicine visit using a video enabled HIPPA compliant telemedicine application. I verified that I am speaking with Jacqueline Sims  using two identifiers. Jacqueline Hall-Potvin, PA-C  was physically located in a [place] [facility] site and [name] was located at a different location.   The limitations of evaluation and management by telemedicine as well as the availability of in-person appointments were discussed. Patient was informed that she  may incur a bill ( including co-pay) for this virtual visit encounter. Jacqueline Sims  expressed understanding and gave verbal consent to proceed with virtual visit.     History of Present Illness:Jacqueline Sims  is a 29 y.o. female presents with concern for sinus infection.  States she has had nasal congestion and sinus pressure for the last month.  Also endorsing rhinorrhea, postnasal drip, facial pain that is unilateral and varies/worsened after after sleeping on a particular side at night.  Patient has been doing Tylenol which helps with intermittent frontal headaches as well as Sudafed which is help with some congestion.  Endorsing daily congestion without second worsening, fever, cough, shortness of breath.  Patient has undergone Covid testing twice given profession and symptoms: Both were negative.  Patient does endorse history of allergies, for which she has been compliant with Allegra and Flonase.  Patient states this feels similar to previous-infection: Last one was about a year ago.  Patient has also been evaluated by ENT previously for symptoms: No routine follow-up.   Past Medical History:  Diagnosis Date  . Anxiety   . Arthritis   . Asthma   . Chronic low back pain 06/19/2018  . Depression   . GERD  (gastroesophageal reflux disease)   . Hay fever   . Iron deficiency anemia   . Migraines   . Neuropathy    Right foot  . Vitamin D deficiency     Allergies  Allergen Reactions  . Lactose Nausea And Vomiting        Observations/Objective: 29 y.o. female sitting in no acute distress.  Patient is able to speak in full sentences without coughing, sneezing, wheezing.  Patient does have congested nasally: No hot potato voice, tachypnea, facial swelling.  Assessment and Plan: H&P consistent with chronic sinusitis, likely maxillary.  Will start Augmentin today, provide Diflucan as patient reports yeast infections status post antibiotic use.  Return precautions discussed, patient verbalized understanding and is agreeable to plan.  Follow Up Instructions: Patient to follow-up with PCP/ENT as needed, or in person sooner for worsening or persistent symptoms.   I discussed the assessment and treatment plan with the patient. The patient was provided an opportunity to ask questions and all were answered. The patient agreed with the plan and demonstrated an understanding of the instructions.   The patient was advised to call back or seek an in-person evaluation if the symptoms worsen or if the condition fails to improve as anticipated.  I provided 15 minutes of non-face-to-face time during this encounter.    [place], PA-C  03/01/2019 10:40 AM        Sims, [place], PA-C 03/01/19 [id number]

## 2019-03-03 ENCOUNTER — Other Ambulatory Visit: Payer: Self-pay | Admitting: Family Medicine

## 2019-03-03 MED ORDER — FLUOXETINE HCL 20 MG PO CAPS: 20.0000 mg | ORAL_CAPSULE | Freq: Every day | ORAL | 3 refills | Status: DC

## 2019-03-04 DIAGNOSIS — H9203 Otalgia, bilateral: Secondary | ICD-10-CM | POA: Diagnosis not present

## 2019-03-04 DIAGNOSIS — Z03818 Encounter for observation for suspected exposure to other biological agents ruled out: Secondary | ICD-10-CM | POA: Diagnosis not present

## 2019-03-04 DIAGNOSIS — Z20828 Contact with and (suspected) exposure to other viral communicable diseases: Secondary | ICD-10-CM | POA: Diagnosis not present

## 2019-03-05 ENCOUNTER — Other Ambulatory Visit: Payer: No Typology Code available for payment source

## 2019-03-06 DIAGNOSIS — M25571 Pain in right ankle and joints of right foot: Secondary | ICD-10-CM | POA: Diagnosis not present

## 2019-03-06 DIAGNOSIS — M2141 Flat foot [pes planus] (acquired), right foot: Secondary | ICD-10-CM | POA: Diagnosis not present

## 2019-03-06 DIAGNOSIS — M2142 Flat foot [pes planus] (acquired), left foot: Secondary | ICD-10-CM | POA: Diagnosis not present

## 2019-03-06 DIAGNOSIS — Z01818 Encounter for other preprocedural examination: Secondary | ICD-10-CM | POA: Diagnosis not present

## 2019-03-06 DIAGNOSIS — Q666 Other congenital valgus deformities of feet: Secondary | ICD-10-CM | POA: Diagnosis not present

## 2019-03-06 DIAGNOSIS — M19071 Primary osteoarthritis, right ankle and foot: Secondary | ICD-10-CM | POA: Diagnosis not present

## 2019-03-06 DIAGNOSIS — M79671 Pain in right foot: Secondary | ICD-10-CM | POA: Diagnosis not present

## 2019-03-28 ENCOUNTER — Other Ambulatory Visit: Payer: Self-pay | Admitting: Family Medicine

## 2019-03-28 DIAGNOSIS — Z3009 Encounter for other general counseling and advice on contraception: Secondary | ICD-10-CM

## 2019-04-07 DIAGNOSIS — N946 Dysmenorrhea, unspecified: Secondary | ICD-10-CM | POA: Diagnosis not present

## 2019-04-07 DIAGNOSIS — R102 Pelvic and perineal pain: Secondary | ICD-10-CM | POA: Diagnosis not present

## 2019-04-07 DIAGNOSIS — M7918 Myalgia, other site: Secondary | ICD-10-CM | POA: Diagnosis not present

## 2019-04-07 DIAGNOSIS — N941 Unspecified dyspareunia: Secondary | ICD-10-CM | POA: Diagnosis not present

## 2019-05-22 ENCOUNTER — Ambulatory Visit: Payer: BC Managed Care – PPO | Attending: Nurse Practitioner | Admitting: Physical Therapy

## 2019-05-28 DIAGNOSIS — N809 Endometriosis, unspecified: Secondary | ICD-10-CM | POA: Diagnosis not present

## 2019-05-30 DIAGNOSIS — Z1159 Encounter for screening for other viral diseases: Secondary | ICD-10-CM | POA: Diagnosis not present

## 2019-05-30 DIAGNOSIS — Z114 Encounter for screening for human immunodeficiency virus [HIV]: Secondary | ICD-10-CM | POA: Diagnosis not present

## 2019-05-30 DIAGNOSIS — Z01812 Encounter for preprocedural laboratory examination: Secondary | ICD-10-CM | POA: Diagnosis not present

## 2019-05-30 DIAGNOSIS — N979 Female infertility, unspecified: Secondary | ICD-10-CM | POA: Diagnosis not present

## 2019-05-30 DIAGNOSIS — Z113 Encounter for screening for infections with a predominantly sexual mode of transmission: Secondary | ICD-10-CM | POA: Diagnosis not present

## 2019-05-31 DIAGNOSIS — J45909 Unspecified asthma, uncomplicated: Secondary | ICD-10-CM | POA: Diagnosis not present

## 2019-06-05 DIAGNOSIS — N809 Endometriosis, unspecified: Secondary | ICD-10-CM | POA: Diagnosis not present

## 2019-06-11 ENCOUNTER — Other Ambulatory Visit: Payer: Self-pay

## 2019-06-12 ENCOUNTER — Encounter: Payer: Self-pay | Admitting: Family Medicine

## 2019-06-12 ENCOUNTER — Ambulatory Visit: Payer: BC Managed Care – PPO | Admitting: Family Medicine

## 2019-06-12 VITALS — BP 118/82 | HR 88 | Temp 97.7°F | Wt 160.0 lb

## 2019-06-12 DIAGNOSIS — Z3009 Encounter for other general counseling and advice on contraception: Secondary | ICD-10-CM | POA: Diagnosis not present

## 2019-06-12 DIAGNOSIS — M67432 Ganglion, left wrist: Secondary | ICD-10-CM | POA: Diagnosis not present

## 2019-06-12 DIAGNOSIS — J4540 Moderate persistent asthma, uncomplicated: Secondary | ICD-10-CM | POA: Diagnosis not present

## 2019-06-12 DIAGNOSIS — F339 Major depressive disorder, recurrent, unspecified: Secondary | ICD-10-CM | POA: Diagnosis not present

## 2019-06-12 MED ORDER — FLUOXETINE HCL 10 MG PO CAPS: 10.0000 mg | ORAL_CAPSULE | Freq: Every day | ORAL | 3 refills | Status: AC

## 2019-06-12 MED ORDER — BUDESONIDE 180 MCG/ACT IN AEPB: 1.0000 | INHALATION_SPRAY | Freq: Two times a day (BID) | RESPIRATORY_TRACT | 5 refills | Status: DC

## 2019-06-12 MED ORDER — LEVALBUTEROL TARTRATE 45 MCG/ACT IN AERO: 2.0000 | INHALATION_SPRAY | RESPIRATORY_TRACT | 4 refills | Status: DC | PRN

## 2019-06-12 NOTE — Patient Instructions (Signed)
http://www.aaaai.org/conditions-and-treatments/asthma">  Asthma, Adult  Asthma is a long-term (chronic) condition that causes recurrent episodes in which the airways become tight and narrow. The airways are the passages that lead from the nose and mouth down into the lungs. Asthma episodes, also called asthma attacks, can cause coughing, wheezing, shortness of breath, and chest pain. The airways can also fill with mucus. During an attack, it can be difficult to breathe. Asthma attacks can range from minor to life threatening. Asthma cannot be cured, but medicines and lifestyle changes can help control it and treat acute attacks. What are the causes? This condition is believed to be caused by inherited (genetic) and environmental factors, but its exact cause is not known. There are many things that can bring on an asthma attack or make asthma symptoms worse (triggers). Asthma triggers are different for each person. Common triggers include:  Mold.  Dust.  Cigarette smoke.  Cockroaches.  Things that can cause allergy symptoms (allergens), such as animal dander or pollen from trees or grass.  Air pollutants such as household cleaners, wood smoke, smog, or chemical odors.  Cold air, weather changes, and winds (which increase molds and pollen in the air).  Strong emotional expressions such as crying or laughing hard.  Stress.  Certain medicines (such as aspirin) or types of medicines (such as beta-blockers).  Sulfites in foods and drinks. Foods and drinks that may contain sulfites include dried fruit, potato chips, and sparkling grape juice.  Infections or inflammatory conditions such as the flu, a cold, or inflammation of the nasal membranes (rhinitis).  Gastroesophageal reflux disease (GERD).  Exercise or strenuous activity. What are the signs or symptoms? Symptoms of this condition may occur right after asthma is triggered or many hours later. Symptoms include:  Wheezing. This can  sound like whistling when you breathe.  Excessive nighttime or early morning coughing.  Frequent or severe coughing with a common cold.  Chest tightness.  Shortness of breath.  Tiredness (fatigue) with minimal activity. How is this diagnosed? This condition is diagnosed based on:  Your medical history.  A physical exam.  Tests, which may include: ? Lung function studies and pulmonary studies (spirometry). These tests can evaluate the flow of air in your lungs. ? Allergy tests. ? Imaging tests, such as X-rays. How is this treated? There is no cure for this condition, but treatment can help control your symptoms. Treatment for asthma usually involves:  Identifying and avoiding your asthma triggers.  Using medicines to control your symptoms. Generally, two types of medicines are used to treat asthma: ? Controller medicines. These help prevent asthma symptoms from occurring. They are usually taken every day. ? Fast-acting reliever or rescue medicines. These quickly relieve asthma symptoms by widening the narrow and tight airways. They are used as needed and provide short-term relief.  Using supplemental oxygen. This may be needed during a severe episode.  Using other medicines, such as: ? Allergy medicines, such as antihistamines, if your asthma attacks are triggered by allergens. ? Immune medicines (immunomodulators). These are medicines that help control the immune system.  Creating an asthma action plan. An asthma action plan is a written plan for managing and treating your asthma attacks. This plan includes: ? A list of your asthma triggers and how to avoid them. ? Information about when medicines should be taken and when their dosage should be changed. ? Instructions about using a device called a peak flow meter. A peak flow meter measures how well the lungs are working   and the severity of your asthma. It helps you monitor your condition. Follow these instructions at  home: Controlling your home environment Control your home environment in the following ways to help avoid triggers and prevent asthma attacks:  Change your heating and air conditioning filter regularly.  Limit your use of fireplaces and wood stoves.  Get rid of pests (such as roaches and mice) and their droppings.  Throw away plants if you see mold on them.  Clean floors and dust surfaces regularly. Use unscented cleaning products.  Try to have someone else vacuum for you regularly. Stay out of rooms while they are being vacuumed and for a short while afterward. If you vacuum, use a dust mask from a hardware store, a double-layered or microfilter vacuum cleaner bag, or a vacuum cleaner with a HEPA filter.  Replace carpet with wood, tile, or vinyl flooring. Carpet can trap dander and dust.  Use allergy-proof pillows, mattress covers, and box spring covers.  Keep your bedroom a trigger-free room.  Avoid pets and keep windows closed when allergens are in the air.  Wash beddings every week in hot water and dry them in a dryer.  Use blankets that are made of polyester or cotton.  Clean bathrooms and kitchens with bleach. If possible, have someone repaint the walls in these rooms with mold-resistant paint. Stay out of the rooms that are being cleaned and painted.  Wash your hands often with soap and water. If soap and water are not available, use hand sanitizer.  Do not allow anyone to smoke in your home. General instructions  Take over-the-counter and prescription medicines only as told by your health care provider. ? Speak with your health care provider if you have questions about how or when to take the medicines. ? Make note if you are requiring more frequent dosages.  Do not use any products that contain nicotine or tobacco, such as cigarettes and e-cigarettes. If you need help quitting, ask your health care provider. Also, avoid being exposed to secondhand smoke.  Use a peak  flow meter as told by your health care provider. Record and keep track of the readings.  Understand and use the asthma action plan to help minimize, or stop an asthma attack, without needing to seek medical care.  Make sure you stay up to date on your yearly vaccinations as told by your health care provider. This may include vaccines for the flu and pneumonia.  Avoid outdoor activities when allergen counts are high and when air quality is low.  Wear a ski mask that covers your nose and mouth during outdoor winter activities. Exercise indoors on cold days if you can.  Warm up before exercising, and take time for a cool-down period after exercise.  Keep all follow-up visits as told by your health care provider. This is important. Where to find more information  For information about asthma, turn to the Centers for Disease Control and Prevention at www.cdc.gov/asthma/faqs.htm  For air quality information, turn to AirNow at https://airnow.gov/ Contact a health care provider if:  You have wheezing, shortness of breath, or a cough even while you are taking medicine to prevent attacks.  The mucus you cough up (sputum) is thicker than usual.  Your sputum changes from clear or white to yellow, green, gray, or bloody.  Your medicines are causing side effects, such as a rash, itching, swelling, or trouble breathing.  You need to use a reliever medicine more than 2-3 times a week.  Your peak   flow reading is still at 50-79% of your personal best after following your action plan for 1 hour.  You have a fever. Get help right away if:  You are getting worse and do not respond to treatment during an asthma attack.  You are short of breath when at rest or when doing very little physical activity.  You have difficulty eating, drinking, or talking.  You have chest pain or tightness.  You develop a fast heartbeat or palpitations.  You have a bluish color to your lips or fingernails.  You  are light-headed or dizzy, or you faint.  Your peak flow reading is less than 50% of your personal best.  You feel too tired to breathe normally. Summary  Asthma is a long-term (chronic) condition that causes recurrent episodes in which the airways become tight and narrow. These episodes can cause coughing, wheezing, shortness of breath, and chest pain.  Asthma cannot be cured, but medicines and lifestyle changes can help control it and treat acute attacks.  Make sure you understand how to avoid triggers and how and when to use your medicines.  Asthma attacks can range from minor to life threatening. Get help right away if you have an asthma attack and do not respond to treatment with your usual rescue medicines. This information is not intended to replace advice given to you by your health care provider. Make sure you discuss any questions you have with your health care provider. Document Revised: 03/07/2018 Document Reviewed: 02/07/2016 Elsevier Patient Education  2020 [employer].  Ganglion Cyst  A ganglion cyst is a non-cancerous, fluid-filled lump that occurs near a joint or tendon. The cyst grows out of a joint or the lining of a tendon. Ganglion cysts most often develop in the hand or wrist, but they can also develop in the shoulder, elbow, hip, knee, ankle, or foot. Ganglion cysts are ball-shaped or egg-shaped. Their size can range from the size of a pea to larger than a grape. Increased activity may cause the cyst to get bigger because more fluid starts to build up. What are the causes? The exact cause of this condition is not known, but it may be related to:  Inflammation or irritation around the joint.  An injury.  Repetitive movements or overuse.  Arthritis. What increases the risk? You are more likely to develop this condition if:  You are a woman.  You are 66-5 years old. What are the signs or symptoms? The main symptom of this condition is a lump. It most  often appears on the hand or wrist. In many cases, there are no other symptoms, but a cyst can sometimes cause:  Tingling.  Pain.  Numbness.  Muscle weakness.  Weak grip.  Less range of motion in a joint. How is this diagnosed? Ganglion cysts are usually diagnosed based on a physical exam. Your health care provider will feel the lump and may shine a light next to it. If it is a ganglion cyst, the light will likely shine through it. Your health care provider may order an X-ray, ultrasound, or MRI to rule out other conditions. How is this treated? Ganglion cysts often go away on their own without treatment. If you have pain or other symptoms, treatment may be needed. Treatment is also needed if the ganglion cyst limits your movement or if it gets infected. Treatment may include:  Wearing a brace or splint on your wrist or finger.  Taking anti-inflammatory medicine.  Having fluid drained from the  lump with a needle (aspiration).  Getting a steroid injected into the joint.  Having surgery to remove the ganglion cyst.  Placing a pad on your shoe or wearing shoes that will not rub against the cyst if it is on your foot. Follow these instructions at home:  Do not press on the ganglion cyst, poke it with a needle, or hit it.  Take over-the-counter and prescription medicines only as told by your health care provider.  If you have a brace or splint: ? Wear it as told by your health care provider. ? Remove it as told by your health care provider. Ask if you need to remove it when you take a shower or a bath.  Watch your ganglion cyst for any changes.  Keep all follow-up visits as told by your health care provider. This is important. Contact a health care provider if:  Your ganglion cyst becomes larger or more painful.  You have pus coming from the lump.  You have weakness or numbness in the affected area.  You have a fever or chills. Get help right away if:  You have a  fever and have any of these in the cyst area: ? Increased redness. ? Red streaks. ? Swelling. Summary  A ganglion cyst is a non-cancerous, fluid-filled lump that occurs near a joint or tendon.  Ganglion cysts most often develop in the hand or wrist, but they can also develop in the shoulder, elbow, hip, knee, ankle, or foot.  Ganglion cysts often go away on their own without treatment. This information is not intended to replace advice given to you by your health care provider. Make sure you discuss any questions you have with your health care provider. Document Revised: 12/15/2016 Document Reviewed: 09/01/2016 Elsevier Patient Education  [place].

## 2019-06-12 NOTE — Progress Notes (Signed)
Subjective:    Patient ID: Jacqueline Sims, female    DOB: Jun 01, 1990, 29 y.o.   MRN: [id number]  No chief complaint on file.   HPI Pt is a 29 yo female with pmh sig for migraines, asthma, allergies,  was seen today for follow-up.  Pt notes increased asthma symptoms when allergy symptoms flare.  Using albuterol inhaler 3-5 days/week.  Albuterol inhaler causes jitteriness.  Pt taking Claritin and Flonase for allergies though still having nasal congestion.  Pt states she in the process of trying to become pregnant.  Seeing a fertility specialist, looking into sperm donation.  Pt to start medications for fertility soon.  Taking PNV.  Pt inquires about Prozac 20 mg being safe in pregnancy.  Pt also inquires about having ganglion cyst on left wrist drained, present x yrs.  Cyst currently causing pain when pt moves her hand.  Pt is left-handed.  At times wears splint to prevent wrist movement.  Past Medical History:  Diagnosis Date  . Anxiety   . Arthritis   . Asthma   . Chronic low back pain 06/19/2018  . Depression   . GERD (gastroesophageal reflux disease)   . Hay fever   . Iron deficiency anemia   . Migraines   . Neuropathy    Right foot  . Vitamin D deficiency     Allergies  Allergen Reactions  . Lactose Nausea And Vomiting    ROS General: Denies fever, chills, night sweats, changes in weight, changes in appetite HEENT: Denies headaches, ear pain, changes in vision, rhinorrhea, sore throat CV: Denies CP, palpitations, SOB, orthopnea Pulm: Denies SOB, cough, wheezing  +chest tightness, asthma GI: Denies abdominal pain, nausea, vomiting, diarrhea, constipation GU: Denies dysuria, hematuria, frequency, vaginal discharge Msk: Denies muscle cramps, joint pains Neuro: Denies weakness, numbness, tingling Skin: Denies rashes, bruising  + cyst on dorsum of L wrist Psych: Denies anxiety, hallucinations  +h/o depression  Objective:    Blood pressure 118/82, pulse 88, temperature  97.7 F (36.5 C), temperature source Temporal, weight 160 lb (72.6 kg), SpO2 98 %.  Gen. Pleasant, well-nourished, in no distress, normal affect   HEENT: [place]/AT, face symmetric, conjunctiva clear, no scleral icterus, PERRLA, EOMI, nares patent without drainage Lungs: no accessory muscle use, CTAB, no wheezes or rales Cardiovascular: RRR, no m/r/g, no peripheral edema Musculoskeletal: Cyst on dorsum of L medial- lateral wrist more noticeable with flexion of wrist.  No deformities, no cyanosis or clubbing, normal tone Neuro:  A&Ox3, CN II-XII intact, normal gait Skin:  Warm, no lesions/ rash.  Dorsum of left wrist with 2 cm mobile fluid-filled cyst.  No erythema or induration of lesion.   Wt Readings from Last 3 Encounters:  06/12/19 160 lb (72.6 kg)  10/21/18 180 lb (81.6 kg)  09/19/18 179 lb 9 oz (81.4 kg)    Lab Results  Component Value Date   WBC 13.2 (H) 06/27/2018   HGB 12.6 09/19/2018   HCT 37.0 09/19/2018   PLT 524.0 (H) 06/27/2018   GLUCOSE 102 (H) 09/19/2018   ALT 17 06/13/2018   AST 17 06/13/2018   NA 139 09/19/2018   K 3.9 09/19/2018   CL 108 09/19/2018   CREATININE 0.70 09/19/2018   BUN 10 09/19/2018   CO2 21 06/27/2018   TSH 5.134 (H) 06/14/2018    Assessment/Plan:  Moderate persistent asthma without complication  -will d/c albuterol 2/2 causing pt to feel jittery - Plan: levalbuterol (XOPENEX HFA) 45 MCG/ACT inhaler, budesonide (PULMICORT) 180 MCG/ACT inhaler  Ganglion cyst of wrist, left -Place referral to hand surgery for removal of cyst. -Given handout - Plan: Ambulatory referral to Hand Surgery  Family planning counseling -Discussed continuing prenatal vitamins -Discussed various pregnancy categories for current medications -Continue following with fertility specialist  Depression, recurrent ([place]) -pt stable -discussed various treatment options during pregnancy.  Pt wishes to wean off prozac. -will d/c prozac 20 mg.  Start prozac 10 mg. -if  needed can continue low dose prozac during pregnancy.  - Plan: FLUoxetine (PROZAC) 10 MG capsule  F/u prn  Grier Mitts, MD

## 2019-06-15 ENCOUNTER — Encounter: Payer: Self-pay | Admitting: Family Medicine

## 2019-06-19 DIAGNOSIS — G609 Hereditary and idiopathic neuropathy, unspecified: Secondary | ICD-10-CM | POA: Diagnosis not present

## 2019-06-19 DIAGNOSIS — M461 Sacroiliitis, not elsewhere classified: Secondary | ICD-10-CM | POA: Diagnosis not present

## 2019-06-24 DIAGNOSIS — M67432 Ganglion, left wrist: Secondary | ICD-10-CM | POA: Diagnosis not present

## 2019-07-02 DIAGNOSIS — Z3201 Encounter for pregnancy test, result positive: Secondary | ICD-10-CM | POA: Diagnosis not present

## 2019-07-02 DIAGNOSIS — N979 Female infertility, unspecified: Secondary | ICD-10-CM | POA: Diagnosis not present

## 2019-07-14 ENCOUNTER — Ambulatory Visit: Payer: BC Managed Care – PPO | Admitting: Family Medicine

## 2019-07-22 DIAGNOSIS — N979 Female infertility, unspecified: Secondary | ICD-10-CM | POA: Diagnosis not present

## 2019-07-22 DIAGNOSIS — Z3201 Encounter for pregnancy test, result positive: Secondary | ICD-10-CM | POA: Diagnosis not present

## 2019-08-20 ENCOUNTER — Encounter: Payer: Self-pay | Admitting: Family Medicine

## 2019-08-20 ENCOUNTER — Ambulatory Visit: Payer: BC Managed Care – PPO | Admitting: Family Medicine

## 2019-08-20 ENCOUNTER — Other Ambulatory Visit: Payer: Self-pay

## 2019-08-20 VITALS — BP 112/80 | HR 83 | Temp 97.7°F | Wt 156.0 lb

## 2019-08-20 DIAGNOSIS — M25561 Pain in right knee: Secondary | ICD-10-CM

## 2019-08-20 DIAGNOSIS — E538 Deficiency of other specified B group vitamins: Secondary | ICD-10-CM | POA: Diagnosis not present

## 2019-08-20 DIAGNOSIS — M7032 Other bursitis of elbow, left elbow: Secondary | ICD-10-CM | POA: Diagnosis not present

## 2019-08-20 DIAGNOSIS — M7031 Other bursitis of elbow, right elbow: Secondary | ICD-10-CM

## 2019-08-20 DIAGNOSIS — M25562 Pain in left knee: Secondary | ICD-10-CM | POA: Diagnosis not present

## 2019-08-20 DIAGNOSIS — E559 Vitamin D deficiency, unspecified: Secondary | ICD-10-CM | POA: Diagnosis not present

## 2019-08-20 NOTE — Progress Notes (Signed)
Subjective:    Patient ID: Jacqueline Sims, female    DOB: Jun 08, 1990, 29 y.o.   MRN: [id number]  No chief complaint on file.   HPI Patient was seen today for ongoing concern.  Patient endorses bilateral knee and elbow pain.  Noted as a dull ache worse with movement in elbows or standing in knees.  Denies edema, erythema, rash, or recent illness.  Pt tried Tylenol, ibuprofen, heat for symptoms.  Pt endorses moving recently but hired movers.  Also keeping busy at work.  Pt has a h/o arthritis in R  Ankle.   Past Medical History:  Diagnosis Date  . Anxiety   . Arthritis   . Asthma   . Chronic low back pain 06/19/2018  . Depression   . GERD (gastroesophageal reflux disease)   . Hay fever   . Iron deficiency anemia   . Migraines   . Neuropathy    Right foot  . Vitamin D deficiency     Allergies  Allergen Reactions  . Lactose Nausea And Vomiting    ROS General: Denies fever, chills, night sweats, changes in weight, changes in appetite HEENT: Denies headaches, ear pain, changes in vision, rhinorrhea, sore throat CV: Denies CP, palpitations, SOB, orthopnea Pulm: Denies SOB, cough, wheezing GI: Denies abdominal pain, nausea, vomiting, diarrhea, constipation GU: Denies dysuria, hematuria, frequency, vaginal discharge Msk: Denies muscle cramps  +joint pains Neuro: Denies weakness, numbness, tingling Skin: Denies rashes, bruising Psych: Denies depression, anxiety, hallucinations     Objective:    There were no vitals taken for this visit. Weight 156.4 pounds, temperature 97.7 F, BP 112/80, pulse 83, pO2 98  Gen. Pleasant, well-nourished, in no distress, normal affect  HEENT: [place]/AT, face symmetric, conjunctiva clear, no scleral icterus, PERRLA, EOMI, nares patent without drainage Lungs: no accessory muscle use Cardiovascular: RRR, no peripheral edema Musculoskeletal: No crepitus of b/l knees.  TTP of medial L knee at joint line and R lateral knee.  No popliteal fossa edema.   B/l patellar tendon laxety noted.  TTP of lateral elbows b/l.  FROM noted of b/l knees and elbows.  Pain with full extension and flexion of elbows.  No deformities, no cyanosis or clubbing, normal tone Neuro:  A&Ox3, CN II-XII intact, normal gait Skin:  Warm, no lesions/ rash   Wt Readings from Last 3 Encounters:  06/12/19 160 lb (72.6 kg)  10/21/18 180 lb (81.6 kg)  09/19/18 179 lb 9 oz (81.4 kg)    Lab Results  Component Value Date   WBC 13.2 (H) 06/27/2018   HGB 12.6 09/19/2018   HCT 37.0 09/19/2018   PLT 524.0 (H) 06/27/2018   GLUCOSE 102 (H) 09/19/2018   ALT 17 06/13/2018   AST 17 06/13/2018   NA 139 09/19/2018   K 3.9 09/19/2018   CL 108 09/19/2018   CREATININE 0.70 09/19/2018   BUN 10 09/19/2018   CO2 21 06/27/2018   TSH 5.134 (H) 06/14/2018    Assessment/Plan:  Radiohumeral bursitis of both elbows  -discussed supportive care including heat, ice, Aspercreme or other topical analgesic - Plan: Sedimentation Rate, C-reactive Protein, CBC (no diff), Vitamin D, 25-hydroxy, Vitamin B12, LUPUS(12) PANEL  Acute pain of both knees  -Discussed possible causes including arthritis, autoimmune disorder given continued arthralgias.   -Discussed strengthening quadricep muscles as patellar tendon laxity noted on exam. -given h/o joint pain and arthritis will re-evaluate for auto immune d/o. -for continued symptoms obtain imaging and consider PT. - Plan: Sedimentation Rate, C-reactive Protein, CBC (  no diff), Vitamin D, 25-hydroxy, Vitamin B12, LUPUS(12) PANEL, TSH, T4, free  F/u prn  Grier Mitts, MD

## 2019-08-20 NOTE — Patient Instructions (Signed)
Elbow Bursitis  Bursitis is swelling and pain at the tip of the elbow. This happens when fluid builds up in a sac under the skin (bursa). This may also be called olecranon bursitis. What are the causes? Elbow bursitis may be caused by:  Elbow injury, such as falling onto the elbow.  Leaning on hard surfaces for long periods of time.  Infection from an injury that breaks the skin near the elbow.  A bone growth (spur) that forms at the tip of the elbow.  A medical condition that causes inflammation, such as gout or rheumatoid arthritis. Sometimes the cause is not known. What are the signs or symptoms? The first sign of elbow bursitis is usually swelling at the tip of the elbow. This can grow to be about the size of a golf ball. Swelling may start suddenly or develop gradually. Other symptoms may include:  Pain when bending or leaning on the elbow.  Not being able to move the elbow normally. If bursitis is caused by an infection, you may have:  Redness, warmth, and tenderness of the elbow.  Drainage of pus from the swollen area over the elbow, if the skin breaks open. How is this diagnosed? This condition may be diagnosed based on:  Your symptoms and medical history.  Any recent injuries you have had.  A physical exam.  X-rays to check for a bone spur or fracture.  Draining fluid from the bursa to test it for infection.  Blood tests to rule out gout or rheumatoid arthritis. How is this treated? Treatment for elbow bursitis depends on the cause. Treatment may include:  Medicines. These may include: ? Over-the-counter medicines to relieve pain and inflammation. ? Antibiotic medicines. ? Injections of anti-inflammatory medicines (steroids).  Draining fluid from the bursa.  Wrapping your elbow with a bandage.  Wearing elbow pads. If these treatments do not help, you may need surgery to remove the bursa. Follow these instructions at home: Medicines  Take  over-the-counter and prescription medicines only as told by your health care provider.  If you were prescribed an antibiotic medicine, take it as told by your health care provider. Do not stop taking the antibiotic even if you start to feel better. Managing pain, stiffness, and swelling   If directed, put ice on your elbow: ? Put ice in a plastic bag. ? Place a towel between your skin and the bag. ? Leave the ice on for 20 minutes, 2-3 times a day.  If your bursitis is caused by an injury, rest your elbow and wear your bandage as told by your health care provider.  Use elbow pads or elbow wraps to cushion your elbow as needed. General instructions  Avoid any activities that cause elbow pain. Ask your health care provider what activities are safe for you.  Keep all follow-up visits as told by your health care provider. This is important. Contact a health care provider if you have:  A fever.  Symptoms that do not get better with treatment.  Pain or swelling that: ? Gets worse. ? Goes away and then comes back.  Pus draining from your elbow. Get help right away if you have:  Trouble moving your arm, hand, or fingers. Summary  Elbow bursitis is inflammation of the fluid-filled sac (bursa) between the tip of your elbow bone (olecranon) and your skin.  Treatment for elbow bursitis depends on the cause. It may include medicines to relieve pain and inflammation, antibiotic medicines, and draining fluid from your elbow.    Contact a health care provider if your symptoms do not get better with treatment, or if your symptoms go away and then come back. This information is not intended to replace advice given to you by your health care provider. Make sure you discuss any questions you have with your health care provider. Document Revised: 12/15/2016 Document Reviewed: 12/12/2016 Elsevier Patient Education  2020 [place].  Elbow Bursitis Rehab Ask your health care provider which  exercises are safe for you. Do exercises exactly as told by your health care provider and adjust them as directed. It is normal to feel mild stretching, pulling, tightness, or discomfort as you do these exercises. Stop right away if you feel sudden pain or your pain gets worse. Do not begin these exercises until told by your health care provider. Stretching and range-of-motion exercises These exercises warm up your muscles and joints and improve the movement and flexibility of your elbow. The exercises also help to relieve pain and swelling. Elbow flexion, assisted 1. Stand or sit with your left / right arm at your side. 2. Use your other hand to gently push your left / right hand toward your shoulder (assisted) while bending your elbow (flexion). 3. Hold this position for __________ seconds. 4. Slowly return your left / right arm to the starting position. Repeat __________ times. Complete this exercise __________ times a day. Elbow extension, assisted 1. Lie on your back in a comfortable position that allows you to relax your arm muscles. 2. Place a folded towel under your left / right upper arm so that your elbow and shoulder are at the same height. 3. Use your other arm to raise your left / right arm (assisted) until your elbow and hand do not rest on the bed or towel. Hold your left / right arm out straight with your other hand supporting it. 4. Let the weight of your hand straighten your elbow (extension). You should feel a stretch on the inside of your elbow. ? Keep your arm and chest muscles relaxed. ? If directed, add a small wrist weight or hand weight to increase the stretch. 5. Hold this position for __________ seconds. 6. Slowly release the stretch and return to the starting position. Repeat __________ times. Complete this exercise __________ times a day. Elbow flexion, active 1. Stand or sit with your left / right elbow bent and your palm facing in, toward your body. 2. Bend your  elbow as far as you can using only your arm muscles (active flexion). 3. Hold this position for __________ seconds. 4. Slowly return to the starting position. Repeat __________ times. Complete this exercise __________ times a day. Elbow extension, active 1. Stand or sit with your left / right elbow bent and your palm facing in, toward your body. 2. Slowly straighten your elbow using only your arm muscles (active extension). Stop when you feel a gentle stretch at the front of your arm, or when your arm is straight. 3. Hold this position for __________ seconds. 4. Slowly return to the starting position. Repeat __________ times. Complete this exercise __________ times a day. Strengthening exercises These exercises build strength and endurance in your elbow. Endurance is the ability to use your muscles for a long time, even after they get tired. Elbow flexion, isometric 1. Stand or sit with your left / right arm at waist height. Your palm should face in, toward your body. 2. Place your other hand on top of your left / right forearm. Gently push down while  you resist with your left / right arm (isometric flexion). ? Use about 50% effort with both arms. You may be instructed to use more and more effort with your arms each week. ? Try not to let your left / right arm move during the exercise. 3. Hold this position for __________ seconds. 4. Let your muscles relax completely before you repeat the exercise. Repeat __________ times. Complete this exercise __________ times a day. Elbow extension, isometric  1. Stand or sit with your left / right arm at waist height. Your palm should face in, toward your body. 2. Place your other hand on the bottom of your left / right forearm. Gently push up while you resist with your left / right arm (isometric extension). ? Use about 50% effort with both arms. You may be instructed to use more and more effort with your arms each week. ? Try not to let your left /  right arm move during the exercise. 3. Hold this position for __________ seconds. 4. Let your muscles relax completely before you repeat the exercise. Repeat __________ times. Complete this exercise __________ times a day. Biceps curls 1. Sit on a stable chair without armrests, or stand up. 2. Hold a _________ weight in your left / right hand. Your palm should face out, away from your body, at the starting position. 3. Bend your left / right elbow and move your hand up toward your shoulder. Keep your elbow at your side while you bend it. 4. Slowly return to the starting position. Repeat __________ times. Complete this exercise __________ times a day. Triceps curls  1. Lie on your back. 2. Hold a _________ weight in your left / right hand. 3. Bend your left / right elbow to a 90-degree angle (right angle), so the weight is in front of your face, over your chest, and your elbow is pointed up to the ceiling. 4. Straighten your elbow, raising your hand toward the ceiling. Use your other hand to support your left / right upper arm and to keep it still. 5. Slowly return to the starting position. Repeat __________ times. Complete this exercise __________ times a day. This information is not intended to replace advice given to you by your health care provider. Make sure you discuss any questions you have with your health care provider. Document Revised: 04/25/2018 Document Reviewed: 01/23/2018 Elsevier Patient Education  [place].

## 2019-08-21 DIAGNOSIS — Z1389 Encounter for screening for other disorder: Secondary | ICD-10-CM | POA: Diagnosis not present

## 2019-08-21 DIAGNOSIS — F4010 Social phobia, unspecified: Secondary | ICD-10-CM | POA: Diagnosis not present

## 2019-08-21 DIAGNOSIS — F331 Major depressive disorder, recurrent, moderate: Secondary | ICD-10-CM | POA: Diagnosis not present

## 2019-08-21 DIAGNOSIS — Z79899 Other long term (current) drug therapy: Secondary | ICD-10-CM | POA: Diagnosis not present

## 2019-08-23 LAB — LUPUS(12) PANEL
Anti Nuclear Antibody (ANA): POSITIVE — AB
C3 Complement: 177 mg/dL (ref 83–193)
C4 Complement: 32 mg/dL (ref 15–57)
ENA SM Ab Ser-aCnc: <1 AI
Rheumatoid fact SerPl-aCnc: <14 IU/mL (ref <14)
Ribosomal P Protein Ab: <1 AI
SM/RNP: <1 AI
SSA (Ro) (ENA) Antibody, IgG: <1 AI
SSB (La) (ENA) Antibody, IgG: <1 AI
Scleroderma (Scl-70) (ENA) Antibody, IgG: <1 AI
Thyroperoxidase Ab SerPl-aCnc: 1 IU/mL (ref <9)
ds DNA Ab: 1 IU/mL

## 2019-08-23 LAB — CBC
HCT: 40.2 % (ref 35.0–45.0)
Hemoglobin: 12.7 g/dL (ref 11.7–15.5)
MCH: 24.7 pg — ABNORMAL LOW (ref 27.0–33.0)
MCHC: 31.6 g/dL — ABNORMAL LOW (ref 32.0–36.0)
MCV: 78.2 fL — ABNORMAL LOW (ref 80.0–100.0)
MPV: 9.8 fL (ref 7.5–12.5)
Platelets: 460 10*3/uL — ABNORMAL HIGH (ref 140–400)
RBC: 5.14 10*6/uL — ABNORMAL HIGH (ref 3.80–5.10)
RDW: 15.3 % — ABNORMAL HIGH (ref 11.0–15.0)
WBC: 9.2 10*3/uL (ref 3.8–10.8)

## 2019-08-23 LAB — ANTI-NUCLEAR AB-TITER (ANA TITER): ANA Titer 1: 1:640 {titer} — ABNORMAL HIGH

## 2019-08-23 LAB — VITAMIN B12: Vitamin B-12: 331 pg/mL (ref 200–1100)

## 2019-08-23 LAB — C-REACTIVE PROTEIN: CRP: 16.8 mg/L — ABNORMAL HIGH (ref <8.0)

## 2019-08-23 LAB — SEDIMENTATION RATE: Sed Rate: 14 mm/h (ref 0–20)

## 2019-08-23 LAB — TSH: TSH: 2.66 mIU/L

## 2019-08-23 LAB — T4, FREE: Free T4: 1.1 ng/dL (ref 0.8–1.8)

## 2019-08-23 LAB — VITAMIN D 25 HYDROXY (VIT D DEFICIENCY, FRACTURES): Vit D, 25-Hydroxy: 12 ng/mL — ABNORMAL LOW (ref 30–100)

## 2019-08-24 DIAGNOSIS — Z20822 Contact with and (suspected) exposure to covid-19: Secondary | ICD-10-CM | POA: Diagnosis not present

## 2019-08-24 DIAGNOSIS — R519 Headache, unspecified: Secondary | ICD-10-CM | POA: Diagnosis not present

## 2019-08-24 DIAGNOSIS — M791 Myalgia, unspecified site: Secondary | ICD-10-CM | POA: Diagnosis not present

## 2019-08-24 DIAGNOSIS — J029 Acute pharyngitis, unspecified: Secondary | ICD-10-CM | POA: Diagnosis not present

## 2019-08-24 DIAGNOSIS — R0981 Nasal congestion: Secondary | ICD-10-CM | POA: Diagnosis not present

## 2019-08-24 DIAGNOSIS — J3489 Other specified disorders of nose and nasal sinuses: Secondary | ICD-10-CM | POA: Diagnosis not present

## 2019-08-25 ENCOUNTER — Other Ambulatory Visit: Payer: Self-pay | Admitting: Family Medicine

## 2019-08-25 DIAGNOSIS — M7652 Patellar tendinitis, left knee: Secondary | ICD-10-CM | POA: Diagnosis not present

## 2019-08-25 DIAGNOSIS — M7651 Patellar tendinitis, right knee: Secondary | ICD-10-CM | POA: Diagnosis not present

## 2019-08-25 DIAGNOSIS — E559 Vitamin D deficiency, unspecified: Secondary | ICD-10-CM

## 2019-08-25 DIAGNOSIS — M6281 Muscle weakness (generalized): Secondary | ICD-10-CM | POA: Diagnosis not present

## 2019-08-25 DIAGNOSIS — R768 Other specified abnormal immunological findings in serum: Secondary | ICD-10-CM

## 2019-08-25 MED ORDER — VITAMIN D (ERGOCALCIFEROL) 1.25 MG (50000 UNIT) PO CAPS: 50000.0000 [IU] | ORAL_CAPSULE | ORAL | 0 refills | Status: AC

## 2019-08-27 ENCOUNTER — Encounter: Payer: Self-pay | Admitting: Family Medicine

## 2019-08-29 ENCOUNTER — Other Ambulatory Visit: Payer: Self-pay

## 2019-08-29 DIAGNOSIS — R768 Other specified abnormal immunological findings in serum: Secondary | ICD-10-CM

## 2019-09-03 DIAGNOSIS — F411 Generalized anxiety disorder: Secondary | ICD-10-CM | POA: Diagnosis not present

## 2019-09-03 DIAGNOSIS — F331 Major depressive disorder, recurrent, moderate: Secondary | ICD-10-CM | POA: Diagnosis not present

## 2019-09-09 DIAGNOSIS — M25562 Pain in left knee: Secondary | ICD-10-CM | POA: Diagnosis not present

## 2019-09-09 DIAGNOSIS — M25561 Pain in right knee: Secondary | ICD-10-CM | POA: Diagnosis not present

## 2019-09-10 DIAGNOSIS — F331 Major depressive disorder, recurrent, moderate: Secondary | ICD-10-CM | POA: Diagnosis not present

## 2019-09-10 DIAGNOSIS — F411 Generalized anxiety disorder: Secondary | ICD-10-CM | POA: Diagnosis not present

## 2019-09-16 DIAGNOSIS — M25562 Pain in left knee: Secondary | ICD-10-CM | POA: Diagnosis not present

## 2019-09-16 DIAGNOSIS — M25561 Pain in right knee: Secondary | ICD-10-CM | POA: Diagnosis not present

## 2019-09-17 DIAGNOSIS — F411 Generalized anxiety disorder: Secondary | ICD-10-CM | POA: Diagnosis not present

## 2019-09-17 DIAGNOSIS — F331 Major depressive disorder, recurrent, moderate: Secondary | ICD-10-CM | POA: Diagnosis not present

## 2019-09-21 DIAGNOSIS — A09 Infectious gastroenteritis and colitis, unspecified: Secondary | ICD-10-CM | POA: Diagnosis not present

## 2019-09-21 DIAGNOSIS — A084 Viral intestinal infection, unspecified: Secondary | ICD-10-CM | POA: Diagnosis not present

## 2019-09-21 DIAGNOSIS — Z20822 Contact with and (suspected) exposure to covid-19: Secondary | ICD-10-CM | POA: Diagnosis not present

## 2019-09-24 DIAGNOSIS — F331 Major depressive disorder, recurrent, moderate: Secondary | ICD-10-CM | POA: Diagnosis not present

## 2019-09-24 DIAGNOSIS — F411 Generalized anxiety disorder: Secondary | ICD-10-CM | POA: Diagnosis not present

## 2019-10-03 DIAGNOSIS — F411 Generalized anxiety disorder: Secondary | ICD-10-CM | POA: Diagnosis not present

## 2019-10-03 DIAGNOSIS — F331 Major depressive disorder, recurrent, moderate: Secondary | ICD-10-CM | POA: Diagnosis not present

## 2019-10-08 DIAGNOSIS — F411 Generalized anxiety disorder: Secondary | ICD-10-CM | POA: Diagnosis not present

## 2019-10-08 DIAGNOSIS — F331 Major depressive disorder, recurrent, moderate: Secondary | ICD-10-CM | POA: Diagnosis not present

## 2019-10-12 ENCOUNTER — Ambulatory Visit
Admission: EM | Admit: 2019-10-12 | Discharge: 2019-10-12 | Disposition: A | Discharge status: Home / Self Care | Payer: BC Managed Care – PPO

## 2019-10-12 ENCOUNTER — Other Ambulatory Visit: Payer: Self-pay

## 2019-10-12 DIAGNOSIS — S91311A Laceration without foreign body, right foot, initial encounter: Secondary | ICD-10-CM

## 2019-10-12 NOTE — ED Provider Notes (Signed)
[facility]    CSN: [id number] Arrival date & time: 10/12/19  1103      History   Chief Complaint Chief Complaint  Patient presents with  . Laceration    right foot    HPI Jacqueline Sims is a 29 y.o. female.   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  She states that she was unable to raise her toe into dorsiflexion.  She is current on her tetanus according to the patient.       Past Medical History:  Diagnosis Date  . Anxiety   . Arthritis   . Asthma   . Chronic low back pain 06/19/2018  . Depression   . GERD (gastroesophageal reflux disease)   . Hay fever   . Iron deficiency anemia   . Migraines   . Neuropathy    Right foot  . Vitamin D deficiency     Patient Active Problem List   Diagnosis Date Noted  . Chronic low back pain 06/19/2018  . Hyperreflexia 06/15/2018  . Ataxia   . Neurologic disorder   . Hyperreflexia of lower extremity 06/14/2018  . Mild asthma 06/14/2018  . Hypokalemia 06/14/2018  . Microcytic hypochromic anemia 06/14/2018  . Thrombocytosis ([place]) 06/14/2018  . Rash 06/14/2018  . Leukocytosis 06/14/2018  . Dysmenorrhea 06/03/2018  . Irritable bowel syndrome 06/03/2018  . Depression, recurrent ([place]) 01/27/2018  . Arthritis 01/27/2018  . Gastroesophageal reflux disease without esophagitis 01/27/2018  . Flat feet, bilateral 01/27/2018  . Rhinitis, allergic 04/17/2006    Past Surgical History:  Procedure Laterality Date  . FLAT FOOT CORRECTION Right 2015  . FOOT SURGERY Right   . LAPAROSCOPY N/A 09/19/2018   Procedure: LAPAROSCOPY DIAGNOSTIC WITH FULGERATION OF ENDOMETRIOSIS;  Surgeon: Tyson Dense, MD;  Location: [facility];  Service: Gynecology;  Laterality: N/A;  . WISDOM TOOTH EXTRACTION      OB History   No obstetric history on file.      Home Medications     Prior to Admission medications   Medication Sig Start Date End Date Taking? Authorizing Provider  acetaminophen (TYLENOL) 500 MG tablet Take 1,000 mg by mouth every 6 (six) hours as needed.   Yes [provider]  albuterol (PROVENTIL HFA;VENTOLIN HFA) 108 (90 Base) MCG/ACT inhaler Inhale 2 puffs into the lungs every 6 (six) hours as needed for wheezing or shortness of breath. 04/11/18  Yes Dutch Quint B, FNP  FLUoxetine (PROZAC) 10 MG capsule Take 1 capsule (10 mg total) by mouth daily. 06/12/19  Yes Billie Ruddy, MD  ibuprofen (ADVIL) 600 MG tablet Take 1 tablet (600 mg total) by mouth every 6 (six) hours as needed. 09/19/18  Yes Tyson Dense, MD  meloxicam (MOBIC) 15 MG tablet Take 15 mg by mouth daily. 09/23/19  Yes [provider]  NORTREL 1/35, 28, tablet TAKE 1 TABLET BY MOUTH ONCE DAILY 03/28/19  Yes Billie Ruddy, MD  omeprazole (PRILOSEC) 20 MG capsule Take 20 mg by mouth every morning.   Yes [provider]  topiramate (TOPAMAX) 50 MG tablet Take 50 mg by mouth daily.   Yes [provider]  Vitamin D, Ergocalciferol, (DRISDOL) 1.25 MG (50000 UNIT) CAPS capsule Take 1 capsule (50,000 Units total) by mouth every 7 (seven) days. 08/25/19  Yes Billie Ruddy, MD  azelastine (ASTELIN) 0.1 % nasal spray Place  1 spray into both nostrils daily. Patient taking differently: Place 1 spray into both nostrils daily as needed for allergies.  03/08/18 10/12/19  Billie Ruddy, MD  budesonide (PULMICORT) 180 MCG/ACT inhaler Inhale 1 puff into the lungs in the morning and at bedtime. 06/12/19 10/12/19  Billie Ruddy, MD    Family History Family History  Problem Relation Age of Onset  . Hypothyroidism Mother   . Multiple sclerosis Other   . Heart disease Maternal Grandmother   . Heart disease Maternal Grandfather     Social History Social History   Tobacco Use  . Smoking status: Never Smoker  . Smokeless tobacco: Never Used  Vaping Use  .  Vaping Use: Never used  Substance Use Topics  . Alcohol use: Yes    Comment: rare  . Drug use: Never     Allergies   Lactose   Review of Systems Review of Systems  Constitutional: Positive for activity change. Negative for appetite change, chills, diaphoresis, fatigue and fever.  Skin: Positive for wound.  All other systems reviewed and are negative.    Physical Exam Triage Vital Signs ED Triage Vitals  Enc Vitals Group     BP 10/12/19 1148 (!) 110/96     Pulse Rate 10/12/19 1148 91     Resp 10/12/19 1148 18     Temp 10/12/19 1148 98.4 F (36.9 C)     Temp Source 10/12/19 1148 Oral     SpO2 10/12/19 1148 97 %     Weight 10/12/19 1146 160 lb (72.6 kg)     Height 10/12/19 1146 5\' 3"  (1.6 m)     Head Circumference --      Peak Flow --      Pain Score 10/12/19 1145 4     Pain Loc --      Pain Edu? --      Excl. in [place]? --    No data found.  Updated Vital Signs BP (!) 110/96 (BP Location: Left Arm)   Pulse 91   Temp 98.4 F (36.9 C) (Oral)   Resp 18   Ht 5\' 3"  (1.6 m)   Wt 160 lb (72.6 kg)   SpO2 97%   BMI 28.34 kg/m   Visual Acuity Right Eye Distance:   Left Eye Distance:   Bilateral Distance:    Right Eye Near:   Left Eye Near:    Bilateral Near:     Physical Exam Vitals and nursing note reviewed.  Constitutional:      General: She is not in acute distress.    Appearance: Normal appearance. She is normal weight. She is not ill-appearing or toxic-appearing.  HENT:     Head: Normocephalic and atraumatic.  Eyes:     Conjunctiva/sclera: Conjunctivae normal.  Musculoskeletal:        General: Tenderness and signs of injury present. Normal range of motion.     Cervical back: Normal range of motion and neck supple.     Comments: There is a small 4 mm transverse laceration over the dorsum of the right foot at the distal to mid point of the first metatarsal.  It is not deep approximately 1 mm.  Very clean edges.  Sensation is intact light touch (patient  has a normal neuropathy from previous surgeries.)  Patient was reluctant to dorsiflex her great toe but with encouragement was able to fully dorsiflex it and hold against clinical resistance.  Skin:    General: Skin is warm and dry.  Neurological:     General: No focal deficit present.     Mental Status: She is alert and oriented to person, place, and time.  Psychiatric:        Mood and Affect: Mood normal.        Behavior: Behavior normal.        Thought Content: Thought content normal.        Judgment: Judgment normal.      UC Treatments / Results  Labs (all labs ordered are listed, but only abnormal results are displayed) Labs Reviewed - No data to display  EKG   Radiology No results found.  Procedures Laceration Repair  Date/Time: 10/12/2019 1:38 PM Performed by: Lorin Picket, PA-C Authorized by: Lorin Picket, PA-C   Consent:    Consent obtained:  Verbal   Consent given by:  Patient   Risks discussed:  Infection and poor cosmetic result Anesthesia (see MAR for exact dosages):    Anesthesia method:  None Laceration details:    Location:  Foot   Foot location:  Top of R foot   Length (cm):  0.4   Depth (mm):  1 Repair type:    Repair type:  Simple Exploration:    Wound exploration: wound explored through full range of motion     Contaminated: no   Treatment:    Area cleansed with:  Betadine   Amount of cleaning:  Standard Skin repair:    Repair method:  Tissue adhesive Approximation:    Approximation:  Loose Post-procedure details:    Patient tolerance of procedure:  Tolerated well, no immediate complications   (including critical care time)  Medications Ordered in UC Medications - No data to display  Initial Impression / Assessment and Plan / UC Course  I have reviewed the triage vital signs and the nursing notes.  Pertinent labs & imaging results that were available during my care of the patient were reviewed by me and considered in my  medical decision making (see chart for details).   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  The first does she was unable to dorsiflex her great toe but on examination she had normal sensation to light touch throughout.  He was able to dorsiflex her toe fully against clinical resistance.  Cosmetic affect was not important to her since she has had previous surgeries of her foot resulting in several surgical scars, it was decided to glue the laceration.  This was performed with tissue adhesive (Dermabond) without incident.  She is given full instructions for proper care and she will contact us if there are any signs or symptoms of infection.  She was cautioned not to pick at the edges of the Dermabond as it loosens and to allow it to fall off of its own accord.  She was current on her tetanus toxoid.   Final Clinical Impressions(s) / UC Diagnoses   Final diagnoses:  Laceration of dorsum of right foot     Discharge Instructions     Keep foot dry for 12 hours.  Afterwards you may perform personal care as necessary.  Do not pick at the glue as it loosens.  You should expect the glue to loosen in 5 to 7 days.  Be aware of any signs or symptoms of infection.  If this occurs return to our clinic or the emergency room as soon as possible.  ED Prescriptions    None     PDMP not reviewed this encounter.   Lorin Picket, PA-C 10/12/19 1723

## 2019-10-12 NOTE — Discharge Instructions (Addendum)
Keep foot dry for 12 hours.  Afterwards you may perform personal care as necessary.  Do not pick at the glue as it loosens.  You should expect the glue to loosen in 5 to 7 days.  Be aware of any signs or symptoms of infection.  If this occurs return to our clinic or the emergency room as soon as possible.

## 2019-10-12 NOTE — ED Triage Notes (Signed)
Patient states that she dropped a knife on the top of her foot around 1 hour ago.

## 2019-10-13 DIAGNOSIS — F331 Major depressive disorder, recurrent, moderate: Secondary | ICD-10-CM | POA: Diagnosis not present

## 2019-10-13 DIAGNOSIS — F4010 Social phobia, unspecified: Secondary | ICD-10-CM | POA: Diagnosis not present

## 2019-10-15 DIAGNOSIS — S96121A Laceration of muscle and tendon of long extensor muscle of toe at ankle and foot level, right foot, initial encounter: Secondary | ICD-10-CM | POA: Diagnosis not present

## 2019-10-15 DIAGNOSIS — F411 Generalized anxiety disorder: Secondary | ICD-10-CM | POA: Diagnosis not present

## 2019-10-15 DIAGNOSIS — F331 Major depressive disorder, recurrent, moderate: Secondary | ICD-10-CM | POA: Diagnosis not present

## 2019-10-15 DIAGNOSIS — S91311A Laceration without foreign body, right foot, initial encounter: Secondary | ICD-10-CM | POA: Diagnosis not present

## 2019-10-16 DIAGNOSIS — S91311A Laceration without foreign body, right foot, initial encounter: Secondary | ICD-10-CM | POA: Diagnosis not present

## 2019-10-19 DIAGNOSIS — Z20822 Contact with and (suspected) exposure to covid-19: Secondary | ICD-10-CM | POA: Diagnosis not present

## 2019-10-20 DIAGNOSIS — S96121A Laceration of muscle and tendon of long extensor muscle of toe at ankle and foot level, right foot, initial encounter: Secondary | ICD-10-CM | POA: Diagnosis not present

## 2019-10-23 DIAGNOSIS — F411 Generalized anxiety disorder: Secondary | ICD-10-CM | POA: Diagnosis not present

## 2019-10-23 DIAGNOSIS — F331 Major depressive disorder, recurrent, moderate: Secondary | ICD-10-CM | POA: Diagnosis not present

## 2019-10-29 DIAGNOSIS — M25571 Pain in right ankle and joints of right foot: Secondary | ICD-10-CM | POA: Diagnosis not present

## 2019-10-29 DIAGNOSIS — S96121A Laceration of muscle and tendon of long extensor muscle of toe at ankle and foot level, right foot, initial encounter: Secondary | ICD-10-CM | POA: Diagnosis not present

## 2019-10-29 DIAGNOSIS — D649 Anemia, unspecified: Secondary | ICD-10-CM | POA: Diagnosis not present

## 2019-10-29 DIAGNOSIS — G8918 Other acute postprocedural pain: Secondary | ICD-10-CM | POA: Diagnosis not present

## 2019-10-29 DIAGNOSIS — W260XXA Contact with knife, initial encounter: Secondary | ICD-10-CM | POA: Diagnosis not present

## 2019-10-29 DIAGNOSIS — K219 Gastro-esophageal reflux disease without esophagitis: Secondary | ICD-10-CM | POA: Diagnosis not present

## 2019-10-29 DIAGNOSIS — Z79899 Other long term (current) drug therapy: Secondary | ICD-10-CM | POA: Diagnosis not present

## 2019-10-29 DIAGNOSIS — J45909 Unspecified asthma, uncomplicated: Secondary | ICD-10-CM | POA: Diagnosis not present

## 2019-10-29 DIAGNOSIS — F32A Depression, unspecified: Secondary | ICD-10-CM | POA: Diagnosis not present

## 2019-10-29 DIAGNOSIS — M2550 Pain in unspecified joint: Secondary | ICD-10-CM | POA: Diagnosis not present

## 2019-11-05 DIAGNOSIS — F411 Generalized anxiety disorder: Secondary | ICD-10-CM | POA: Diagnosis not present

## 2019-11-05 DIAGNOSIS — F331 Major depressive disorder, recurrent, moderate: Secondary | ICD-10-CM | POA: Diagnosis not present

## 2019-11-06 DIAGNOSIS — S96121D Laceration of muscle and tendon of long extensor muscle of toe at ankle and foot level, right foot, subsequent encounter: Secondary | ICD-10-CM | POA: Diagnosis not present

## 2019-11-11 DIAGNOSIS — F331 Major depressive disorder, recurrent, moderate: Secondary | ICD-10-CM | POA: Diagnosis not present

## 2019-11-11 DIAGNOSIS — Z1389 Encounter for screening for other disorder: Secondary | ICD-10-CM | POA: Diagnosis not present

## 2019-11-12 DIAGNOSIS — F331 Major depressive disorder, recurrent, moderate: Secondary | ICD-10-CM | POA: Diagnosis not present

## 2019-11-12 DIAGNOSIS — F411 Generalized anxiety disorder: Secondary | ICD-10-CM | POA: Diagnosis not present

## 2019-11-13 DIAGNOSIS — S96121A Laceration of muscle and tendon of long extensor muscle of toe at ankle and foot level, right foot, initial encounter: Secondary | ICD-10-CM | POA: Diagnosis not present

## 2019-11-16 DIAGNOSIS — Z09 Encounter for follow-up examination after completed treatment for conditions other than malignant neoplasm: Secondary | ICD-10-CM | POA: Diagnosis not present

## 2019-11-19 DIAGNOSIS — F411 Generalized anxiety disorder: Secondary | ICD-10-CM | POA: Diagnosis not present

## 2019-11-19 DIAGNOSIS — S96121A Laceration of muscle and tendon of long extensor muscle of toe at ankle and foot level, right foot, initial encounter: Secondary | ICD-10-CM | POA: Diagnosis not present

## 2019-11-19 DIAGNOSIS — F331 Major depressive disorder, recurrent, moderate: Secondary | ICD-10-CM | POA: Diagnosis not present

## 2019-11-24 DIAGNOSIS — Z01818 Encounter for other preprocedural examination: Secondary | ICD-10-CM | POA: Diagnosis not present

## 2019-11-26 DIAGNOSIS — S96121A Laceration of muscle and tendon of long extensor muscle of toe at ankle and foot level, right foot, initial encounter: Secondary | ICD-10-CM | POA: Diagnosis not present

## 2019-11-26 DIAGNOSIS — F32A Depression, unspecified: Secondary | ICD-10-CM | POA: Diagnosis not present

## 2019-11-26 DIAGNOSIS — F419 Anxiety disorder, unspecified: Secondary | ICD-10-CM | POA: Diagnosis not present

## 2019-11-26 DIAGNOSIS — K219 Gastro-esophageal reflux disease without esophagitis: Secondary | ICD-10-CM | POA: Diagnosis not present

## 2019-11-26 DIAGNOSIS — M7989 Other specified soft tissue disorders: Secondary | ICD-10-CM | POA: Diagnosis not present

## 2019-11-26 DIAGNOSIS — M79671 Pain in right foot: Secondary | ICD-10-CM | POA: Diagnosis not present

## 2019-11-26 DIAGNOSIS — Z791 Long term (current) use of non-steroidal anti-inflammatories (NSAID): Secondary | ICD-10-CM | POA: Diagnosis not present

## 2019-11-26 DIAGNOSIS — Z793 Long term (current) use of hormonal contraceptives: Secondary | ICD-10-CM | POA: Diagnosis not present

## 2019-11-26 DIAGNOSIS — S96121D Laceration of muscle and tendon of long extensor muscle of toe at ankle and foot level, right foot, subsequent encounter: Secondary | ICD-10-CM | POA: Diagnosis not present

## 2019-11-26 DIAGNOSIS — G8918 Other acute postprocedural pain: Secondary | ICD-10-CM | POA: Diagnosis not present

## 2019-11-26 DIAGNOSIS — J45909 Unspecified asthma, uncomplicated: Secondary | ICD-10-CM | POA: Diagnosis not present

## 2019-11-26 DIAGNOSIS — S96111A Strain of muscle and tendon of long extensor muscle of toe at ankle and foot level, right foot, initial encounter: Secondary | ICD-10-CM | POA: Diagnosis not present

## 2019-11-26 DIAGNOSIS — M25571 Pain in right ankle and joints of right foot: Secondary | ICD-10-CM | POA: Diagnosis not present

## 2019-11-26 DIAGNOSIS — X58XXXA Exposure to other specified factors, initial encounter: Secondary | ICD-10-CM | POA: Diagnosis not present

## 2019-11-26 DIAGNOSIS — R000 Tachycardia, unspecified: Secondary | ICD-10-CM | POA: Diagnosis not present

## 2019-11-26 DIAGNOSIS — Z9889 Other specified postprocedural states: Secondary | ICD-10-CM | POA: Diagnosis not present

## 2019-12-03 DIAGNOSIS — F331 Major depressive disorder, recurrent, moderate: Secondary | ICD-10-CM | POA: Diagnosis not present

## 2019-12-03 DIAGNOSIS — F411 Generalized anxiety disorder: Secondary | ICD-10-CM | POA: Diagnosis not present

## 2019-12-04 DIAGNOSIS — S96121D Laceration of muscle and tendon of long extensor muscle of toe at ankle and foot level, right foot, subsequent encounter: Secondary | ICD-10-CM | POA: Diagnosis not present

## 2019-12-10 DIAGNOSIS — F331 Major depressive disorder, recurrent, moderate: Secondary | ICD-10-CM | POA: Diagnosis not present

## 2019-12-10 DIAGNOSIS — F411 Generalized anxiety disorder: Secondary | ICD-10-CM | POA: Diagnosis not present

## 2020-02-10 IMAGING — DX DG HAND COMPLETE 3+V*R*
3 series · 3 of 3 positions shown · non-contrast
Comparison: None.

CLINICAL DATA: Cut right ring finger on food process or blade.
Wound at base of right fourth digit

EXAM:
RIGHT HAND - COMPLETE 3+ VIEW

[hand pa]
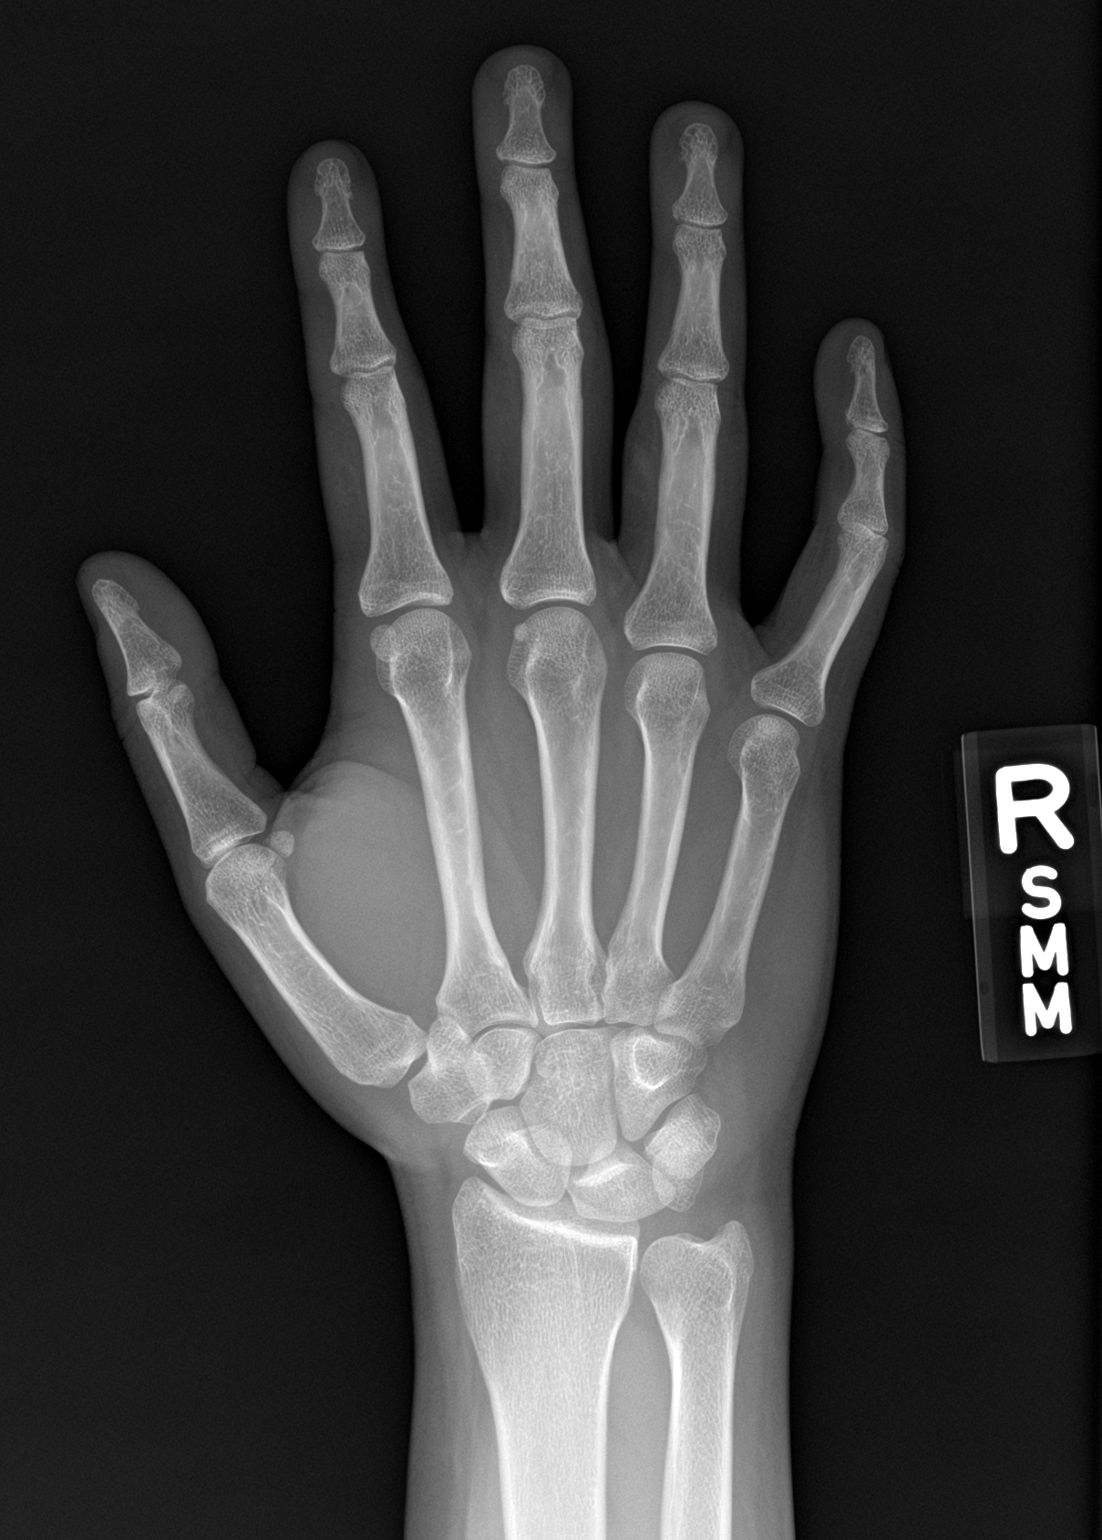

[hand obl]
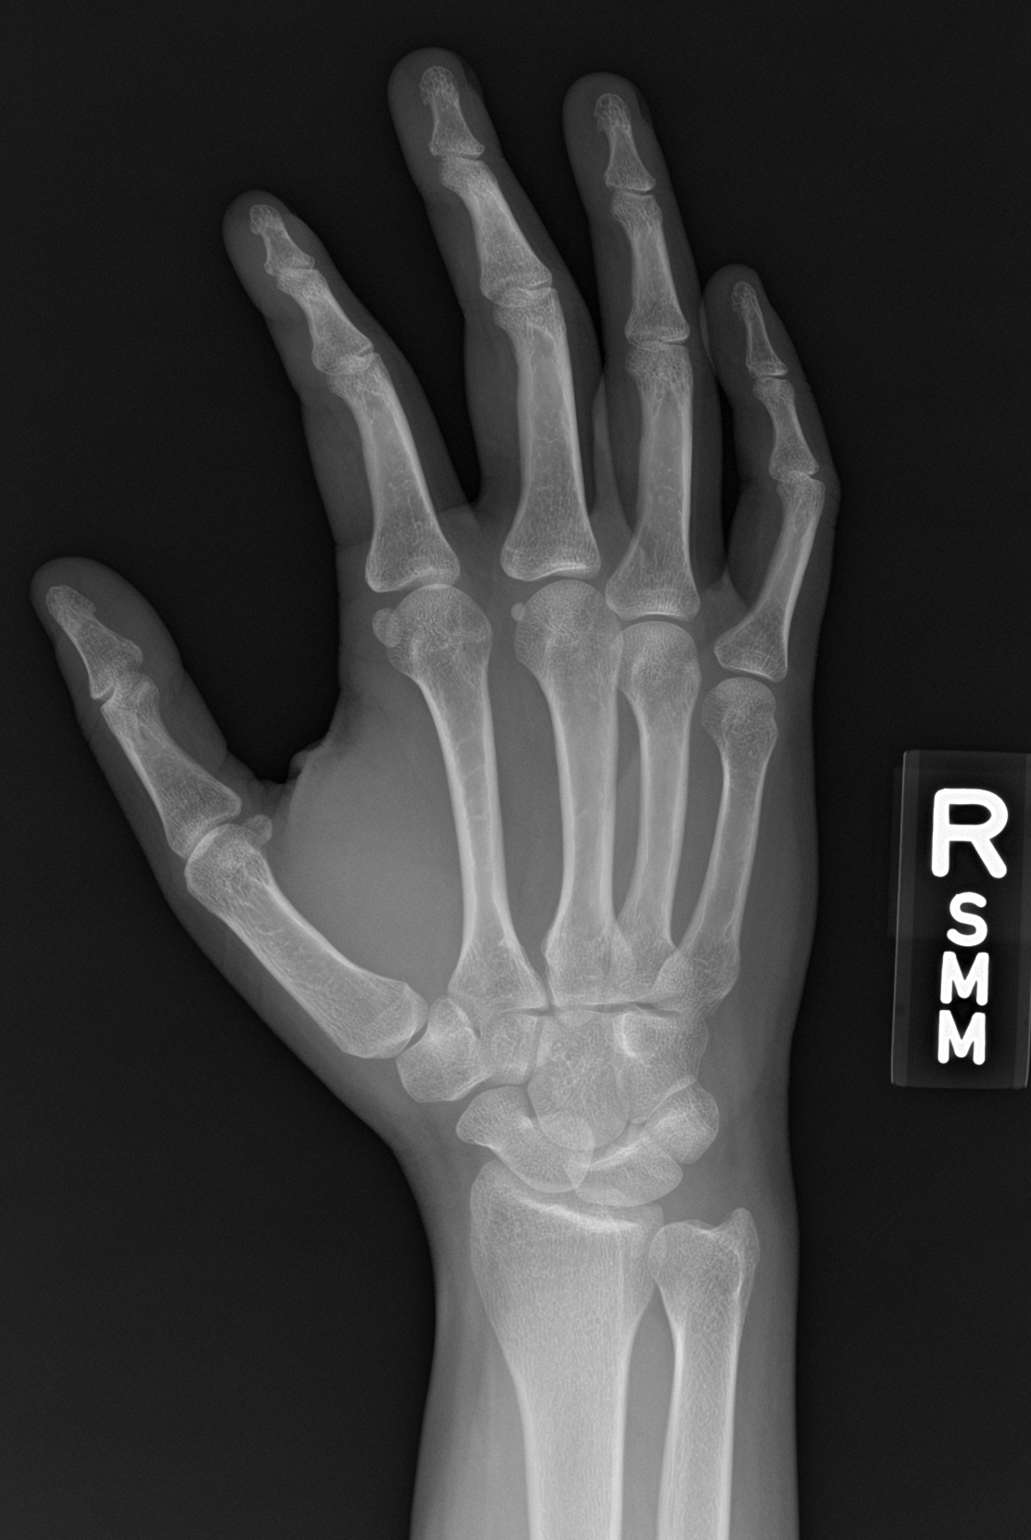

[hand lat]
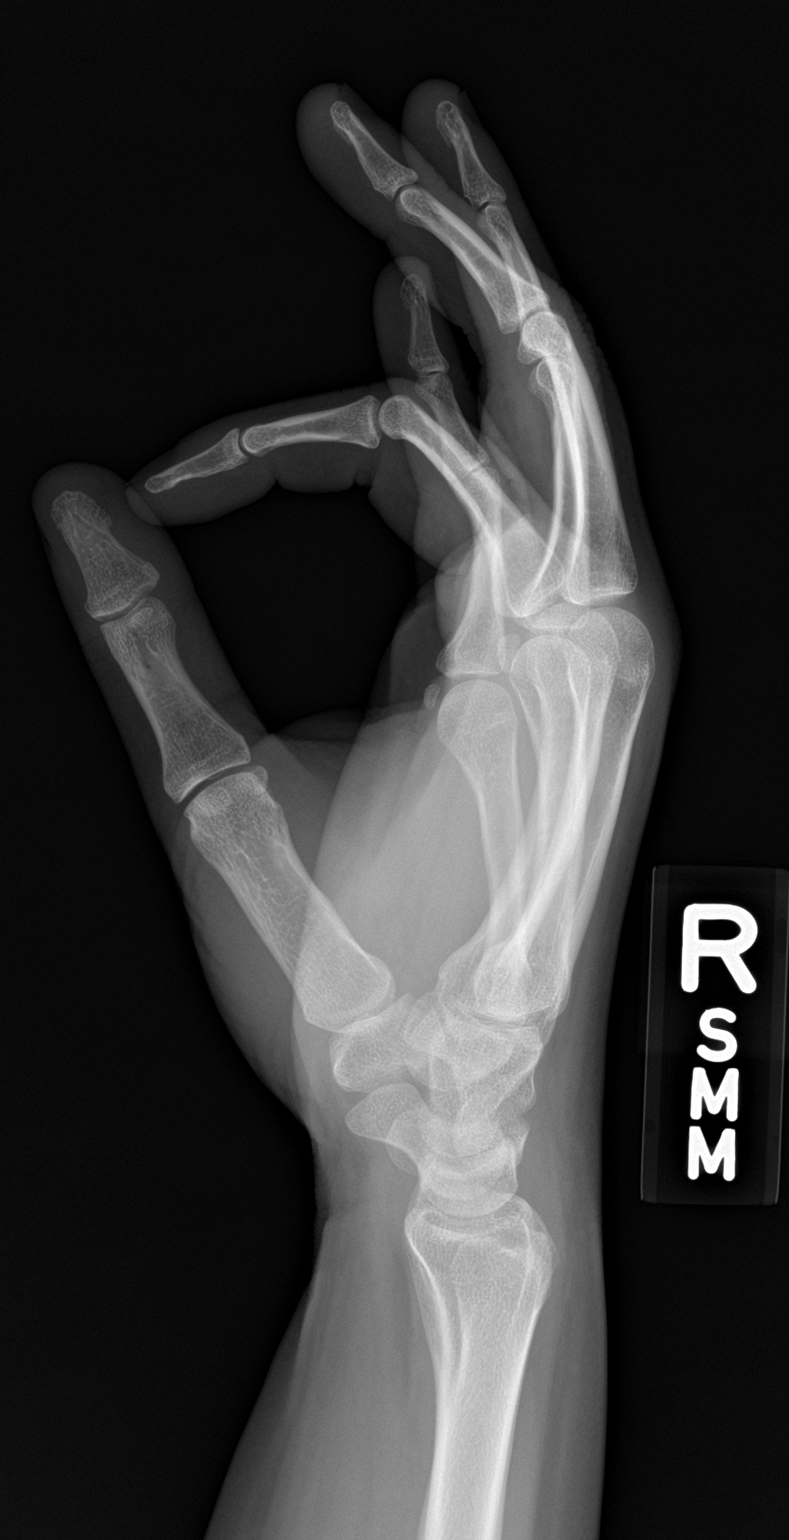

[3 of 3 positions shown; findings below may reference images not displayed]

FINDINGS: There is no evidence of fracture or dislocation. There is no
evidence of arthropathy or other focal bone abnormality. Soft
tissues are unremarkable. No foreign body is demonstrated.
IMPRESSION: Negative.
